# Patient Record
Sex: Female | Born: 1980
Health system: Southern US, Community
[De-identification: ages and names within clinical notes are randomized; demographics above are authoritative.]

## PROBLEM LIST (undated history)

## (undated) DIAGNOSIS — F419 Anxiety disorder, unspecified: Secondary | ICD-10-CM

## (undated) DIAGNOSIS — G43909 Migraine, unspecified, not intractable, without status migrainosus: Secondary | ICD-10-CM

## (undated) DIAGNOSIS — F329 Major depressive disorder, single episode, unspecified: Secondary | ICD-10-CM

## (undated) DIAGNOSIS — E282 Polycystic ovarian syndrome: Secondary | ICD-10-CM

## (undated) DIAGNOSIS — F32A Depression, unspecified: Secondary | ICD-10-CM

## (undated) HISTORY — PX: OTHER SURGICAL HISTORY: SHX169

## (undated) HISTORY — DX: Anxiety disorder, unspecified: F41.9

## (undated) HISTORY — DX: Polycystic ovarian syndrome: E28.2

## (undated) HISTORY — PX: TOOTH EXTRACTION: SUR596

---

## 2000-01-18 ENCOUNTER — Other Ambulatory Visit: Admission: RE | Admit: 2000-01-18 | Discharge: 2000-01-18 | Payer: Self-pay | Admitting: Obstetrics and Gynecology

## 2001-01-24 ENCOUNTER — Other Ambulatory Visit: Admission: RE | Admit: 2001-01-24 | Discharge: 2001-01-24 | Payer: Self-pay | Admitting: Obstetrics and Gynecology

## 2002-08-09 ENCOUNTER — Other Ambulatory Visit: Admission: RE | Admit: 2002-08-09 | Discharge: 2002-08-09 | Payer: Self-pay | Admitting: Obstetrics and Gynecology

## 2007-09-06 ENCOUNTER — Inpatient Hospital Stay (HOSPITAL_COMMUNITY): Admission: AD | Admit: 2007-09-06 | Discharge: 2007-09-07 | Payer: Self-pay | Admitting: Obstetrics and Gynecology

## 2007-10-12 ENCOUNTER — Ambulatory Visit: Payer: Self-pay | Admitting: Nurse Practitioner

## 2007-12-07 ENCOUNTER — Ambulatory Visit: Payer: Self-pay | Admitting: Nurse Practitioner

## 2008-01-09 ENCOUNTER — Emergency Department (HOSPITAL_COMMUNITY): Admission: EM | Admit: 2008-01-09 | Discharge: 2008-01-09 | Payer: Self-pay | Admitting: Emergency Medicine

## 2008-03-09 ENCOUNTER — Inpatient Hospital Stay (HOSPITAL_COMMUNITY): Admission: AD | Admit: 2008-03-09 | Discharge: 2008-03-12 | Payer: Self-pay | Admitting: Obstetrics and Gynecology

## 2008-03-16 ENCOUNTER — Encounter: Admission: RE | Admit: 2008-03-16 | Discharge: 2008-04-15 | Payer: Self-pay | Admitting: Obstetrics and Gynecology

## 2008-04-16 ENCOUNTER — Encounter: Admission: RE | Admit: 2008-04-16 | Discharge: 2008-05-13 | Payer: Self-pay | Admitting: Obstetrics and Gynecology

## 2008-05-14 ENCOUNTER — Encounter: Admission: RE | Admit: 2008-05-14 | Discharge: 2008-06-13 | Payer: Self-pay | Admitting: Obstetrics and Gynecology

## 2008-06-14 ENCOUNTER — Encounter: Admission: RE | Admit: 2008-06-14 | Discharge: 2008-07-13 | Payer: Self-pay | Admitting: Obstetrics and Gynecology

## 2008-07-14 ENCOUNTER — Encounter: Admission: RE | Admit: 2008-07-14 | Discharge: 2008-07-26 | Payer: Self-pay | Admitting: Obstetrics and Gynecology

## 2010-04-19 ENCOUNTER — Other Ambulatory Visit: Payer: Self-pay

## 2010-04-19 DIAGNOSIS — G4482 Headache associated with sexual activity: Secondary | ICD-10-CM

## 2010-05-20 ENCOUNTER — Other Ambulatory Visit (HOSPITAL_COMMUNITY): Payer: Self-pay | Admitting: *Deleted

## 2010-05-20 DIAGNOSIS — R1011 Right upper quadrant pain: Secondary | ICD-10-CM

## 2010-05-26 ENCOUNTER — Ambulatory Visit (HOSPITAL_COMMUNITY)
Admission: RE | Admit: 2010-05-26 | Discharge: 2010-05-26 | Disposition: A | Discharge status: Home / Self Care | Payer: 59 | Source: Ambulatory Visit | Attending: *Deleted | Admitting: *Deleted

## 2010-05-26 DIAGNOSIS — R1011 Right upper quadrant pain: Secondary | ICD-10-CM | POA: Insufficient documentation

## 2010-06-23 ENCOUNTER — Other Ambulatory Visit (HOSPITAL_COMMUNITY): Payer: Self-pay | Admitting: *Deleted

## 2010-06-23 DIAGNOSIS — R1011 Right upper quadrant pain: Secondary | ICD-10-CM

## 2010-06-23 DIAGNOSIS — R110 Nausea: Secondary | ICD-10-CM

## 2010-07-01 ENCOUNTER — Other Ambulatory Visit (HOSPITAL_COMMUNITY): Payer: 59

## 2010-07-06 ENCOUNTER — Ambulatory Visit (HOSPITAL_COMMUNITY)
Admission: RE | Admit: 2010-07-06 | Discharge: 2010-07-06 | Disposition: A | Discharge status: Home / Self Care | Payer: 59 | Source: Ambulatory Visit | Attending: *Deleted | Admitting: *Deleted

## 2010-07-06 DIAGNOSIS — R1011 Right upper quadrant pain: Secondary | ICD-10-CM | POA: Insufficient documentation

## 2010-07-06 DIAGNOSIS — R110 Nausea: Secondary | ICD-10-CM | POA: Insufficient documentation

## 2010-07-06 MED ORDER — TECHNETIUM TC 99M MEBROFENIN IV KIT
5.0000 | PACK | Freq: Once | INTRAVENOUS | Status: AC | PRN
  Administered 2010-07-06: 5.3 via INTRAVENOUS

## 2010-07-28 NOTE — Assessment & Plan Note (Signed)
[name], [name]            ACCOUNT NO.:  [id number]   MEDICAL RECORD NO.:  [id number]          PATIENT TYPE:  POB   LOCATION:  [facility] at [facility]         FACILITY:  [facility]   PHYSICIAN:  Ginger Carne, MD DATE OF BIRTH:  February 24, 1981   DATE OF SERVICE:                                  CLINIC NOTE   The patient comes to the office today for followup on her migraine  headaches and tension headaches.  The patient is currently 26 weeks and  6 days pregnant.  She is doing very well with her pregnancy overall  other than some fatigue.  This is related to working, having a 2-year-  old, and in the process of the selling the house.  Overall, she states  that she definitely feels significantly improved.  She was unable to  start integrative therapy, part this was due to her schedule and part it  was due to the financial aspects.  She has been taking the Flexeril half  or 1 tablet at bedtime and that is definitely helping her.  She has  taken the prednisone and that was helpful.  She has also been taking the  Phenergan and rarely the Percocet, all have been very helpful for her in  controlling her headaches.  She is doing a much better job with this in  general.  She does not feel that she needs a followup at this time.  She  will continue her prenatal care with Dr. Jennette Kettle and return here on an as-  needed basis.      Remonia Richter, NP    ______________________________  Ginger Carne, MD    LR/MEDQ  D:  12/07/2007  T:  12/08/2007  Job:  [phone]

## 2010-07-28 NOTE — Assessment & Plan Note (Signed)
NAMESAAVI, [name]            ACCOUNT NO.:  [id number]   MEDICAL RECORD NO.:  [id number]          PATIENT TYPE:  POB   LOCATION:  [facility] at [facility]         FACILITY:  [facility]   PHYSICIAN:  Ginger Carne, MD DATE OF BIRTH:  April 30, 1980   DATE OF SERVICE:                                  CLINIC NOTE   The patient comes to the office today for headache consultation.  She is  currently 18 weeks and 4 days pregnant.  She is a patient of Dr. Donnetta Hail  and is being cared for from a pregnancy standpoint in that office.  Her  history includes a past medical history of migraine headaches.  She has  had approximately one every 2 months until recently.  In the past, she  has taken Midrin and Tylenol and it has worked well for her.  She has  had one significantly severe enough to send her to the emergency room  recently.  She is G2 P1.  She states that her headaches started when she  was 30 years old.  She denies any aura.  Her headaches are in her right  temple and her right occipital region.  She does have approximately 2  severe per month.  These last approximately 2-3 days each time.  She has  3-4 moderate each month, which are usually cared for with Tylenol and  one mild per month.  She has an MRI taken at [facility].  Her  job is relatively stressful and she is focused on a computer.  She is  again currently pregnant which limits the medicines that she has been  able to take.  So, Dr Jennette Kettle has given her some Percocet and she has been  able to take the Percocet half a tablet and that has worked well for  her.  She has also been given Zofran and Phenergan and each of those has  worked well for her.  She does have some significant triggers including  being very stressed.  Her husband was recently discharged from the [facility]  under less than honorable conditions.  He has been accused of child  pornography.  He had been in the brig for approximately 9 months and is  currently  unemployed.  They are stretched rather financially at this  point as he is not working and she is supporting the family.  The  patient denies any problems with her sleep.   ALLERGIES:  MACROBID and KEFLEX   IMMUNIZATIONS:  She has been immunized to rubella, tetanus, and flu.   OBSTETRICAL HISTORY:  She is gravida 2, para 1.   MEDICAL HISTORY:  She has a history of asthma as a child and migraine  headaches.   SOCIAL HISTORY:  She lives with her husband and child at home.  She  works outside of the home.  She drinks approximately 1-2 caffeinated  beverages per day.   REVIEW OF SYSTEMS:  The patient admits to having frequent headaches,  dizzy spells, photosensitivity, cough, nausea, loss of urine when she  coughs or sneezes and some hot flashes.   PHYSICAL EXAMINATION:  VITAL SIGNS:  Blood pressure is 114/76, weight is  157, height is 5  feet 2 inches, and pulse is 82.  GENERAL:  Well-developed, well-nourished 29 year old Caucasian female in  no acute distress.  The patient's head is normocephalic and atraumatic.  HEENT:  Eyes are equal, pupils react.  NEUROLOGICAL:  The patient is alert, oriented.  Her speech is fluid.  She has no abnormalities of coordination, strength, speech, or thoughts.   ASSESSMENT:  1. Migraine headaches.  2. Muscle spasm.  3. Pregnancy.   PLAN:  We did discuss migraine and muscle spasm at length.  The patient  is having some significant muscle strain as she is sitting at her  computer.  She will start on Flexeril 10 mg one-half tablet at bedtime  #30 with 3 refills.  She will also be referred to integrative therapy to  evaluate and treat her muscle spasms.  She will get a refill on her as  Zofran 4 mg 1 p.o. q.6 h p.r.n. nausea and #20 with 1 refill.  She will  also be given a prednisone pack prednisone 20 mg 4 x1 day, 3 x1 day, 2  x1 day, and 1x1 day #10 with no refills to take if she gets into a  severe headache that is lasting for several days.  We  also did suggest  trigger point injections if she gets into a bad spell, she can always  use that as an option.  The patient is asked to return in 3 weeks or  p.r.n.      Angela Richter, NP    ______________________________  Ginger Carne, MD    LR/MEDQ  D:  10/12/2007  T:  10/13/2007  Job:  [id number]

## 2010-08-26 ENCOUNTER — Encounter: Payer: 59 | Admitting: Family Medicine

## 2010-09-04 ENCOUNTER — Ambulatory Visit (INDEPENDENT_AMBULATORY_CARE_PROVIDER_SITE_OTHER): Payer: 59 | Admitting: Family Medicine

## 2010-09-04 VITALS — BP 113/72 | Ht 61.0 in | Wt 168.0 lb

## 2010-09-04 DIAGNOSIS — M25579 Pain in unspecified ankle and joints of unspecified foot: Secondary | ICD-10-CM

## 2010-09-04 DIAGNOSIS — M2140 Flat foot [pes planus] (acquired), unspecified foot: Secondary | ICD-10-CM

## 2010-09-04 NOTE — Progress Notes (Signed)
  Subjective:    Patient ID: [name], female    DOB: 10-Oct-1980, 30 y.o.   MRN: [id number]  HPI  Referred for bilateral ankle pain. Saw Dr. Margaretha Sheffield he thinks custom molded orthotics might help her bilateral ankle pain. Pain has been going on for a couple of years but worse in the last few months. Worse with standing or walking. Pain is 4/10 at its worst. It does not radiate, it is not associated with any numbness. She has not had any particular injury to her ankles or feet and has not had surgery. She is not diabetic.  Review of Systems Some weight gain with the birth of her last child and small amounts that he gained since then. Has also noticed that her feet are about half size bigger than they were before the birth of her child.    Objective:   Physical Exam GENERAL: Well-developed female no acute distress V.: Pes planus bilaterally ANKLE: Ligaments intact. Normal anterior drawer normal strength in dorsiflexion and plantar flexion. GAIT: Normal stride length. He'll [place]. During stance phase she has a loss of medial collapse of the ankle into a varus position at the ankle joint. This is particularly pronounced on the right.  Patient was fitted for a : standard, cushioned, semi-rigid orthotic. The orthotic was heated, placed on the orthotic stand. The patient was positioned in subtalar neutral position and 10 degrees of ankle dorsiflexion in a weight bearing stance on the heated orthotic blank After completion of molding, a stable base was applied to the orthotic blank. The blank was ground to a stable position for weight bearing. Blank: blue swirl Base:white base Posting:medial heel and flrefoot  Size: 8        Assessment & Plan:  Bilateral ankle pain related to functional varus deformity at her ankle during walking. We may custom molded orthotics today spending 35 minutes face-to-face. She will see how these work for her and if there not improving her symptoms she will  return.

## 2010-11-05 ENCOUNTER — Encounter: Payer: Self-pay | Admitting: Family Medicine

## 2010-11-05 ENCOUNTER — Ambulatory Visit (INDEPENDENT_AMBULATORY_CARE_PROVIDER_SITE_OTHER): Payer: 59 | Admitting: Family Medicine

## 2010-11-05 VITALS — BP 111/76 | HR 80 | Ht 62.0 in | Wt 170.0 lb

## 2010-11-05 DIAGNOSIS — M2140 Flat foot [pes planus] (acquired), unspecified foot: Secondary | ICD-10-CM

## 2010-11-05 DIAGNOSIS — M722 Plantar fascial fibromatosis: Secondary | ICD-10-CM

## 2010-11-05 NOTE — Progress Notes (Signed)
  Subjective:    Patient ID: [name], female    DOB: October 20, 1980, 30 y.o.   MRN: [id number]  HPI Bilateral heel pain. And got some orthotics which really helped her ankle pain. Over the last few weeks she's noticed she started to have some pain on the bottom part of her heel similar to plantar fasciitis type pain. Worse with standing long periods. Has had a.m. pain with the first day. No numbness or tingling in her toes she's had no new injury and has not changed her work habits or work environment.   Review of Systems Denies rash, erythema or numbness in her feet.    Objective:   Physical Exam   GENERAL: Well-developed female no acute distress FEET: Significant pes planus with medial foot collapse inward. She also has some also transverse arch. She has a short wide foot. She is neurovascularly intact in her foot. GA IT: Normal stride length. On stance phase she has significant medial foot collapse without her orthotics.     Assessment & Plan:  Bilateral plantar fasciitis Improved bilateral ankle pain with continued pes planus now significantly improved but not totally corrected by her orthotics. Today added small gray colored scaphoid pads. This seemed to improve the pressure on the medial part of her foot. I think she's having some impingement of the plantar nerve there. We'll try this for a few weeks it is not improving she'll return in orthotics will make some additional adjustments

## 2010-12-18 LAB — CBC
HCT: 30 % — ABNORMAL LOW (ref 36.0–46.0)
Hemoglobin: 10.2 g/dL — ABNORMAL LOW (ref 12.0–15.0)
Hemoglobin: 9.4 g/dL — ABNORMAL LOW (ref 12.0–15.0)
MCHC: 33.1 g/dL (ref 30.0–36.0)
MCHC: 34 g/dL (ref 30.0–36.0)
MCV: 79.1 fL (ref 78.0–100.0)
RBC: 3.55 MIL/uL — ABNORMAL LOW (ref 3.87–5.11)
RBC: 3.79 MIL/uL — ABNORMAL LOW (ref 3.87–5.11)
RDW: 15.6 % — ABNORMAL HIGH (ref 11.5–15.5)
WBC: 10.8 10*3/uL — ABNORMAL HIGH (ref 4.0–10.5)

## 2010-12-18 LAB — RPR: RPR Ser Ql: NONREACTIVE

## 2011-09-10 ENCOUNTER — Ambulatory Visit (INDEPENDENT_AMBULATORY_CARE_PROVIDER_SITE_OTHER): Payer: 59 | Admitting: Family Medicine

## 2011-09-10 ENCOUNTER — Encounter: Payer: Self-pay | Admitting: Family Medicine

## 2011-09-10 VITALS — BP 120/78 | HR 69

## 2011-09-10 DIAGNOSIS — M722 Plantar fascial fibromatosis: Secondary | ICD-10-CM

## 2011-09-10 DIAGNOSIS — M25579 Pain in unspecified ankle and joints of unspecified foot: Secondary | ICD-10-CM

## 2011-09-10 DIAGNOSIS — M2140 Flat foot [pes planus] (acquired), unspecified foot: Secondary | ICD-10-CM

## 2011-09-10 NOTE — Progress Notes (Signed)
  Subjective:    Patient ID: [name], female    DOB: 14-Feb-1981, 31 y.o.   MRN: [id number]  HPI  Ms. watch her very is a pleasant 30 years of patient in followup of her bilateral ankle pain, but her plantar fasciitis. She was seen recently, in the plan with her was to make custom orthotics for her which she has used in the past and hasn't helped her tremendously with her collateral ankle pain and Plantar fasciitis. She states that her bilateral ankle pain still present, on and off, worse with certain activities coming through arrest, custom orthotics definitely help her with the pain. We will proceed with her new custom orthotics today   Patient Active Problem List  Diagnosis  . Pes planus  . Ankle pain  . Plantar fasciitis   No current outpatient prescriptions on file prior to visit.   Allergies  Allergen Reactions  . Nitrofurantoin Monohyd Macro Hives and Itching  . Cephalexin Rash     Review of Systems  Constitutional: Negative for fever, chills, diaphoresis and fatigue.  Musculoskeletal: Positive for gait problem.  Neurological: Negative for weakness and numbness.       Objective:   Physical Exam  Constitutional: She is oriented to person, place, and time. She appears well-developed and well-nourished.       BP 120/78  Pulse 69   Pulmonary/Chest: Effort normal.  Musculoskeletal:       Bilateral ankles with full range of motion. The recurrence of the patient in the medial aspect of both ankles. There is collapse of the mid arch of the foot. Gait with bilateral heel pronation Neurovascular intact  Neurological: She is alert and oriented to person, place, and time.  Skin: Skin is warm. No rash noted. No erythema.  Psychiatric: She has a normal mood and affect. Her behavior is normal. Thought content normal.          Assessment & Plan:   1. Plantar fasciitis   2. Ankle pain   3. Pes planus    Patient was fitted for a : standard, cushioned,  semi-rigid orthotic. The orthotic was heated and afterward the patient stood on the orthotic blank positioned on the orthotic stand. The patient was positioned in subtalar neutral position and 10 degrees of ankle dorsiflexion in a weight bearing stance. After completion of molding, a stable base was applied to the orthotic blank. The blank was ground to a stable position for weight bearing. Size:9 Base:red Posting:anteromedial and posteromedial B/L Additional orthotic padding:none

## 2011-09-30 ENCOUNTER — Encounter: Payer: 59 | Admitting: Internal Medicine

## 2011-10-12 ENCOUNTER — Ambulatory Visit (INDEPENDENT_AMBULATORY_CARE_PROVIDER_SITE_OTHER): Payer: 59 | Admitting: Nurse Practitioner

## 2011-10-12 ENCOUNTER — Ambulatory Visit: Payer: 59 | Admitting: Nurse Practitioner

## 2011-10-12 ENCOUNTER — Encounter: Payer: Self-pay | Admitting: Nurse Practitioner

## 2011-10-12 VITALS — BP 105/76 | HR 63 | Ht 61.75 in | Wt 167.0 lb

## 2011-10-12 DIAGNOSIS — E669 Obesity, unspecified: Secondary | ICD-10-CM

## 2011-10-12 DIAGNOSIS — F329 Major depressive disorder, single episode, unspecified: Secondary | ICD-10-CM | POA: Insufficient documentation

## 2011-10-12 DIAGNOSIS — Z6837 Body mass index (BMI) 37.0-37.9, adult: Secondary | ICD-10-CM | POA: Insufficient documentation

## 2011-10-12 DIAGNOSIS — G43009 Migraine without aura, not intractable, without status migrainosus: Secondary | ICD-10-CM | POA: Insufficient documentation

## 2011-10-12 MED ORDER — RIZATRIPTAN BENZOATE 10 MG PO TABS: 10.0000 mg | ORAL_TABLET | ORAL | Status: DC | PRN

## 2011-10-12 MED ORDER — IBUPROFEN 800 MG PO TABS: 800.0000 mg | ORAL_TABLET | Freq: Three times a day (TID) | ORAL | Status: AC | PRN

## 2011-10-12 NOTE — Patient Instructions (Signed)

## 2011-10-12 NOTE — Progress Notes (Signed)
S: Pt last seen Sept 2009 when she was pregnant. She since delivered a baby girl and has done well with migraines without aura until the last 6 months. She has been diagnosed with major depression and is receiving counseling with Valinda Hoar. She has been started on Effexor 75 mg. She took a friends Relpax and it worked very well to make her migraine go away. Currently she is having one bad migraine per week that puts her to bed. She is using a [place] IUD for birth control. She and her husband are doing better and he has found a job. He still feels self conscious about being on the sexual offender registry. She is requesting a trigger point injection today for the right side of her head.   O: General NAD, 31 YO Caucasian female Head tenderness at right occipital and trap regions [facility] negative  Trigger Point injections: see procedure note 5cc in right occipital, trapezius and right temple  A: Migraine without aura Depression  P: Trigger point injections Maxalt 10 mg po prn Motrin 800mg  TID prn Offered Physical Therapy/ Pt declined RTC prn

## 2011-12-03 ENCOUNTER — Encounter: Payer: Self-pay | Admitting: Family Medicine

## 2011-12-03 ENCOUNTER — Ambulatory Visit (HOSPITAL_COMMUNITY)
Admission: RE | Admit: 2011-12-03 | Discharge: 2011-12-03 | Disposition: A | Discharge status: Home / Self Care | Payer: 59 | Source: Ambulatory Visit | Attending: Family Medicine | Admitting: Family Medicine

## 2011-12-03 ENCOUNTER — Ambulatory Visit (INDEPENDENT_AMBULATORY_CARE_PROVIDER_SITE_OTHER): Payer: 59 | Admitting: Family Medicine

## 2011-12-03 VITALS — BP 124/81 | HR 93 | Ht 61.75 in | Wt 167.0 lb

## 2011-12-03 DIAGNOSIS — M25539 Pain in unspecified wrist: Secondary | ICD-10-CM | POA: Insufficient documentation

## 2011-12-03 DIAGNOSIS — M25531 Pain in right wrist: Secondary | ICD-10-CM

## 2011-12-03 MED ORDER — TRAMADOL HCL 50 MG PO TABS: ORAL_TABLET | ORAL | Status: DC

## 2011-12-06 ENCOUNTER — Telehealth: Payer: Self-pay | Admitting: Family Medicine

## 2011-12-06 NOTE — Telephone Encounter (Signed)
AMy Can u call her and tellher the X RAYS are NORMAL. Go ahead and start the exercise program. She can f/u 1 month or prn THANKS! Denny Levy

## 2011-12-06 NOTE — Telephone Encounter (Signed)
Spoke with pt- gave her x-ray results. 

## 2011-12-06 NOTE — Telephone Encounter (Signed)
Called pt, unable to leave VM.  Will try later.

## 2011-12-07 DIAGNOSIS — M25531 Pain in right wrist: Secondary | ICD-10-CM | POA: Insufficient documentation

## 2011-12-07 NOTE — Progress Notes (Signed)
  Subjective:    Patient ID: [name], female    DOB: 04/30/1980, 31 y.o.   MRN: [id number]  HPI  Right wrist pain, episodic, occurred on 2 separate occasions. No known injury although in retrospect the day before the last episode she had done asked her vacuuming because she got a new back and cleaner. She does not recall any specific change in activities the day before the first episode. During the episode pain is 9/10, keeps her from sleeping. Aching in the wrist up into the forearm. First episode was on Labor Day this year and the second episode was 2 days prior to this office visit.  She took over-the-counter remedies including NSAIDs and had little or no relief. Gradually over the 24-hour period the pain resolved. She did not have any numbness in the hand, she noted no color change in the skin, no specific swelling.   PERTINENT  PMH / PSH: No personal history of diabetes mellitus.  Right wrist/hand injury many years ago when she fell while roller skating. Evidently was in a thumb spica splint for about 6 weeks for a scaphoid fracture. Has not had any problems with her wrist since then until these 2 episodes. She is an MRI tech at: [employer] Review of Systems With each episode she had no associated symptoms of fever, sweats, chills. In the recent few months she has not had any unusual weight change.    Objective:   Physical Exam Vital signs are reviewed GENERAL: Well-developed female no acute distress WRISTS: Bilaterally there is no evidence of swelling. The wrists are symmetrical. The skin is normal in color. RIGHT wrist: Slight tenderness over the dorsal scapholunate area. This increases with resisted wrist extension. Otherwise the wrist has normal range of motion. Fingers have normal range of motion in all planes and intact strength and neurovascular status.  ; Right wrist or Center reveals no fluid in the radiocarpal joint. There is a mild amount of increased fluid around  the ECRB and ECRL tendons in the second compartment but the tendons are intact and move normally. I cannot identify with certainty the scapholunate ligament. I do not appreciate a widened scapholunate space however this is not diagnostic on ultrasound.       Assessment & Plan:

## 2011-12-07 NOTE — Assessment & Plan Note (Signed)
I will get x-rays to further rule out any residual scaphoid pads college he as well as evaluate the scapholunate distance on a clenched fist view. I suspect that each of these episodes is brought about by some unnoticed but increased activity such as extra vacuuming the day before the last episode. I have put her on her wrist strengthening program. She will hold on that until we get her x-rays. I've also given her small amount of tramadol in case she has another episode of pain. I will contact her after the x-ray results. If they are negative then I would just continue with the wrist rehabilitation and followup when necessary.

## 2012-04-26 ENCOUNTER — Emergency Department (HOSPITAL_COMMUNITY): Payer: 59

## 2012-04-26 ENCOUNTER — Encounter (HOSPITAL_COMMUNITY): Payer: Self-pay

## 2012-04-26 ENCOUNTER — Emergency Department (HOSPITAL_COMMUNITY)
Admission: EM | Admit: 2012-04-26 | Discharge: 2012-04-26 | Disposition: A | Discharge status: Home / Self Care | Payer: 59 | Attending: Emergency Medicine | Admitting: Emergency Medicine

## 2012-04-26 DIAGNOSIS — F329 Major depressive disorder, single episode, unspecified: Secondary | ICD-10-CM | POA: Insufficient documentation

## 2012-04-26 DIAGNOSIS — F3289 Other specified depressive episodes: Secondary | ICD-10-CM | POA: Insufficient documentation

## 2012-04-26 DIAGNOSIS — H5710 Ocular pain, unspecified eye: Secondary | ICD-10-CM | POA: Insufficient documentation

## 2012-04-26 DIAGNOSIS — H53149 Visual discomfort, unspecified: Secondary | ICD-10-CM | POA: Insufficient documentation

## 2012-04-26 DIAGNOSIS — Z79899 Other long term (current) drug therapy: Secondary | ICD-10-CM | POA: Insufficient documentation

## 2012-04-26 DIAGNOSIS — R209 Unspecified disturbances of skin sensation: Secondary | ICD-10-CM | POA: Insufficient documentation

## 2012-04-26 DIAGNOSIS — G43909 Migraine, unspecified, not intractable, without status migrainosus: Secondary | ICD-10-CM | POA: Insufficient documentation

## 2012-04-26 DIAGNOSIS — R112 Nausea with vomiting, unspecified: Secondary | ICD-10-CM | POA: Insufficient documentation

## 2012-04-26 HISTORY — DX: Major depressive disorder, single episode, unspecified: F32.9

## 2012-04-26 HISTORY — DX: Depression, unspecified: F32.A

## 2012-04-26 HISTORY — DX: Migraine, unspecified, not intractable, without status migrainosus: G43.909

## 2012-04-26 MED ORDER — HYDROMORPHONE HCL PF 1 MG/ML IJ SOLN
1.0000 mg | Freq: Once | INTRAMUSCULAR | Status: AC
  Administered 2012-04-26: 1 mg via INTRAMUSCULAR
  Filled 2012-04-26: qty 1

## 2012-04-26 MED ORDER — DIPHENHYDRAMINE HCL 50 MG/ML IJ SOLN
25.0000 mg | Freq: Once | INTRAMUSCULAR | Status: AC
  Administered 2012-04-26: 25 mg via INTRAVENOUS
  Filled 2012-04-26: qty 1

## 2012-04-26 MED ORDER — DEXAMETHASONE SODIUM PHOSPHATE 10 MG/ML IJ SOLN
10.0000 mg | Freq: Once | INTRAMUSCULAR | Status: AC
  Administered 2012-04-26: 10 mg via INTRAVENOUS
  Filled 2012-04-26: qty 1

## 2012-04-26 MED ORDER — ONDANSETRON HCL 4 MG/2ML IJ SOLN
4.0000 mg | Freq: Once | INTRAMUSCULAR | Status: AC
  Administered 2012-04-26: 4 mg via INTRAVENOUS
  Filled 2012-04-26: qty 2

## 2012-04-26 MED ORDER — METOCLOPRAMIDE HCL 5 MG/ML IJ SOLN
10.0000 mg | Freq: Once | INTRAMUSCULAR | Status: AC
  Administered 2012-04-26: 10 mg via INTRAVENOUS
  Filled 2012-04-26: qty 2

## 2012-04-26 NOTE — ED Notes (Signed)
Pt c/o migraine headache with n/v since last night.  Took tramadol with some relief last night but this morning is unable to keep anything down.  Says usually takes maxalt but ran out.  Pt says left hand feels tingly.

## 2012-04-26 NOTE — ED Notes (Signed)
Pt given cola, tolerated well thus far.

## 2012-04-26 NOTE — ED Provider Notes (Signed)
History     CSN: [id number]  Arrival date & time 04/26/12  1034   First MD Initiated Contact with Patient 04/26/12 1054      Chief Complaint  Patient presents with  . Headache    (Consider location/radiation/quality/duration/timing/severity/associated sxs/prior treatment) HPI Comments: [name] is a 32 y.o. Female presenting withmigraine headache which started gradually last night.  The patient has a history of migraine and the current symptoms are similar to previous episodes of migraine headache in location, but she has new faint tingling in her left hand which she has not experienced before,  Although has had left facial tingling with migraine in the past which is not present today.  She is very nauseated with emesis, not able to keep any PO's down.  She has photophobia which is consistent with migraine.   The patient has bilateral foehead and posterior eye pain.  There has been no fevers, chills, syncope, confusion or localized weakness.  The patient tried tramadol without relief of symptoms.  She usually improves with maxalt when taken early in the course of her headache.  She has run out of her home supply.        The history is provided by the patient and the spouse.    Past Medical History  Diagnosis Date  . Migraines   . Depression     History reviewed. No pertinent past surgical history.  No family history on file.  History  Substance Use Topics  . Smoking status: Never Smoker   . Smokeless tobacco: Never Used  . Alcohol Use: No    OB History   Grav Para Term Preterm Abortions TAB SAB Ect Mult Living                  Review of Systems  Constitutional: Negative for fever and chills.  HENT: Negative for congestion, sore throat, facial swelling, neck pain and neck stiffness.   Eyes: Positive for photophobia.  Respiratory: Negative for chest tightness and shortness of breath.   Cardiovascular: Negative for chest pain.  Gastrointestinal: Positive  for nausea and vomiting. Negative for abdominal pain.  Genitourinary: Negative.   Musculoskeletal: Negative for joint swelling and arthralgias.  Skin: Negative.  Negative for rash and wound.  Neurological: Positive for numbness and headaches. Negative for dizziness, tremors, facial asymmetry, speech difficulty, weakness and light-headedness.  Psychiatric/Behavioral: Negative.     Allergies  Nitrofurantoin monohyd macro and Cephalexin  Home Medications   Current Outpatient Rx  Name  Route  Sig  Dispense  Refill  . acetaminophen (TYLENOL) 500 MG tablet   Oral   Take 1,000 mg by mouth every 6 (six) hours as needed for pain.         . pseudoephedrine (SUDAFED) 30 MG tablet   Oral   Take 30 mg by mouth every 4 (four) hours as needed for congestion.         . traMADol (ULTRAM) 50 MG tablet   Oral   Take 50 mg by mouth every 6 (six) hours as needed for pain.         Marland Kitchen venlafaxine XR (EFFEXOR-XR) 150 MG 24 hr capsule   Oral   Take 150 mg by mouth daily.         . rizatriptan (MAXALT) 10 MG tablet   Oral   Take 1 tablet (10 mg total) by mouth as needed for migraine. May repeat in 2 hours if needed   12 tablet   11  BP 112/65  Pulse 96  Temp(Src) 97.6 F (36.4 C) (Oral)  Resp 17  Ht 5\' 1"  (1.549 m)  Wt 170 lb (77.111 kg)  BMI 32.14 kg/m2  SpO2 99%  LMP 04/23/2012  Physical Exam  Nursing note and vitals reviewed. Constitutional: She is oriented to person, place, and time. She appears well-developed and well-nourished.  Uncomfortable appearing  HENT:  Head: Normocephalic and atraumatic.  Mouth/Throat: Oropharynx is clear and moist.  Eyes: Conjunctivae are normal. Pupils are equal, round, and reactive to light.  Neck: Normal range of motion. Neck supple.  Cardiovascular: Normal rate and normal heart sounds.   Pulmonary/Chest: Effort normal.  Abdominal: Soft. There is no tenderness.  Musculoskeletal: Normal range of motion.  Lymphadenopathy:    She has no  cervical adenopathy.  Neurological: She is alert and oriented to person, place, and time. She has normal strength. No cranial nerve deficit or sensory deficit. Coordination and gait normal. GCS eye subscore is 4. GCS verbal subscore is 5. GCS motor subscore is 6.  Cranial nerves III-XII intact.  No pronator drift.  Equal grip strength.  Skin: Skin is warm and dry. No rash noted.  Psychiatric: She has a normal mood and affect. Her speech is normal and behavior is normal. Thought content normal. Cognition and memory are normal.    ED Course  Procedures (including critical care time)  Labs Reviewed - No data to display Ct Head Wo Contrast  04/26/2012  *RADIOLOGY REPORT*  Clinical Data: Headache.  CT HEAD WITHOUT CONTRAST  Technique:  Contiguous axial images were obtained from the base of the skull through the vertex without contrast.  Comparison: No priors.  Findings: No acute intracranial abnormalities.  Specifically, no evidence of acute intracerebral hemorrhage, no definite findings of acute/subacute cerebral ischemia, no mass, mass effect, hydrocephalus or abnormal intra or extra-axial fluid collections. No acute displaced skull fractures are identified.  Visualized paranasal sinuses and mastoids are well pneumatized.  IMPRESSION: 1.  No acute intracranial abnormalities. 2.  The appearance of the brain is normal.   Original Report Authenticated By: Trudie Reed, M.D.      1. Migraine headache     Patient given NS IV 1 liter,  Medicated with Migraine cocktail of decadron, benadryl and reglan.  No relief of headache or nausea.  CT head ordered.  She was given dilaudid 1 mg and zofran 4 IV.  Near complete relief of headache,  No nausea.  Pt has tolerated PO liquids in ed and was ready and anxious to go home at time of dc.    MDM  Pt with history of migraine and similar sx today,  Although different pattern of neuropathy,  No focal weakness appreciated.  PRN f/u.  Ct head results reviewed and  discussed with patient.          Burgess Amor, [place] 04/26/12 1806

## 2012-04-26 NOTE — ED Notes (Signed)
States has had headache since last night. Reports increased pain with light. Denies neck pain pain and vision changes. Pt has been treated for migraines over several years. Pt reports N/V.  Pt took tylenol and sudafed this morning. NAD noted.

## 2012-04-28 NOTE — ED Provider Notes (Signed)
Medical screening examination/treatment/procedure(s) were performed by non-physician practitioner and as supervising physician I was immediately available for consultation/collaboration.  Keyonia Gluth, MD 04/28/12 2048 

## 2012-06-26 ENCOUNTER — Institutional Professional Consult (permissible substitution): Payer: 59 | Admitting: Pulmonary Disease

## 2012-06-27 ENCOUNTER — Encounter: Payer: Self-pay | Admitting: Pulmonary Disease

## 2012-06-27 ENCOUNTER — Ambulatory Visit (INDEPENDENT_AMBULATORY_CARE_PROVIDER_SITE_OTHER): Payer: 59 | Admitting: Pulmonary Disease

## 2012-06-27 VITALS — BP 104/80 | HR 102 | Temp 98.4°F | Ht 62.0 in | Wt 188.8 lb

## 2012-06-27 DIAGNOSIS — G4733 Obstructive sleep apnea (adult) (pediatric): Secondary | ICD-10-CM

## 2012-06-27 NOTE — Progress Notes (Signed)
Subjective:    Patient ID: [name], female    DOB: 03-17-1980, 32 y.o.   MRN: [id number]  HPI The patient is a 32 year old female who comes in today for evaluation of possible sleep apnea.  She has significant snoring, and has been told that she has an abnormal breathing pattern during sleep at times.  She denies having choking arousals.  She has frequent awakenings at night, and does not feel rested in the mornings upon arising.  She finds that sleeping longer really does not help her feel any better.  She has definite periods of sleepiness with inactivity during the day, and is extremely fatigued.  She can also follow sleep easily and the evenings watching television.  She denies any sleepiness with driving.  The patient states that her weight is up 25 pounds over the last 2 years, and her fourth score today is 7.   Sleep Questionnaire What time do you typically go to bed?( Between what hours) 10-11p depends on shift at Hospital--sometimes at 9pm 10-11p depends on shift at Hospital--sometimes at 9pm at 1441 on 06/27/12 by Nita Sells, CMA How long does it take you to fall asleep? 15 mins +/- 15 mins +/- at 1441 on 06/27/12 by Nita Sells, CMA How many times during the night do you wake up? 3 3 at 1441 on 06/27/12 by Nita Sells, CMA What time do you get out of bed to start your day? [id number] [phone] at 1441 on 06/27/12 by Nita Sells, CMA Do you drive or operate heavy machinery in your occupation? No No at 1441 on 06/27/12 by Nita Sells, CMA How much has your weight changed (up or down) over the past two years? (In pounds) 25 lb (11.34 kg)25 lb (11.34 kg) increase at 1441 on 06/27/12 by Nita Sells, CMA Have you ever had a sleep study before? No No at 1441 on 06/27/12 by Marjo Bicker Mabe, CMA Do you currently use CPAP? No No at 1441 on 06/27/12 by Marjo Bicker Mabe, CMA Do you wear oxygen at any time? No No at 1441 on 06/27/12 by Marjo Bicker Mabe, CMA   Review of  Systems  Constitutional: Negative for fever and unexpected weight change.  HENT: Negative for ear pain, nosebleeds, congestion, sore throat, rhinorrhea, sneezing, trouble swallowing, dental problem, postnasal drip and sinus pressure.        Seasonal allergy symptoms   Eyes: Negative for redness and itching.  Respiratory: Negative for cough, chest tightness, shortness of breath and wheezing.   Cardiovascular: Negative for palpitations and leg swelling.  Gastrointestinal: Negative for nausea and vomiting.  Genitourinary: Negative for dysuria.  Musculoskeletal: Negative for joint swelling.  Skin: Negative for rash.  Neurological: Positive for headaches ( chronic migraines).  Hematological: Does not bruise/bleed easily.  Psychiatric/Behavioral: Positive for dysphoric mood. The patient is nervous/anxious.        Controlled with medication       Objective:   Physical Exam Constitutional:  Overweight female, no acute distress  HENT:  Nares patent without discharge  Oropharynx without exudate, palate and uvula are mildly elongated, some narrowing posteriorly from side walls  Eyes:  Perrla, eomi, no scleral icterus  Neck:  No JVD, no TMG  Cardiovascular:  Normal rate, regular rhythm, no rubs or gallops.  No murmurs        Intact distal pulses  Pulmonary :  Normal breath sounds, no stridor or respiratory distress   No rales, rhonchi, or wheezing  Abdominal:  Soft, nondistended, bowel sounds present.  No tenderness noted.   Musculoskeletal:  No lower extremity edema noted.  Lymph Nodes:  No cervical lymphadenopathy noted  Skin:  No cyanosis noted  Neurologic:  Alert, appropriate, moves all 4 extremities without obvious deficit.         Assessment & Plan:

## 2012-06-27 NOTE — Assessment & Plan Note (Signed)
The patient's history is very suggestive of obstructive sleep apnea, and I have had a long discussion with her about the pathophysiology and impacted quality of life and cardiovascular health.  I think she would benefit from a sleep study, and is a good candidate for home sleep testing.  The patient is agreeable to this approach.

## 2012-06-27 NOTE — Patient Instructions (Addendum)
Will schedule you for a home sleep study, and arrange followup once the results are available.  Work on weight reduction.

## 2012-07-10 ENCOUNTER — Ambulatory Visit (INDEPENDENT_AMBULATORY_CARE_PROVIDER_SITE_OTHER): Payer: 59 | Admitting: Pulmonary Disease

## 2012-07-10 DIAGNOSIS — G4733 Obstructive sleep apnea (adult) (pediatric): Secondary | ICD-10-CM

## 2012-07-11 DIAGNOSIS — G4733 Obstructive sleep apnea (adult) (pediatric): Secondary | ICD-10-CM

## 2012-07-13 ENCOUNTER — Encounter: Payer: Self-pay | Admitting: Pulmonary Disease

## 2012-07-14 ENCOUNTER — Telehealth: Payer: Self-pay | Admitting: Pulmonary Disease

## 2012-07-14 NOTE — Telephone Encounter (Signed)
Discussed HST with pt.  She does not have osa, but I suspect she may have UARS.  I have recommended working on positional therapy and weight loss.  She is to call if worsening symptoms.

## 2012-07-14 NOTE — Telephone Encounter (Signed)
Per [facility], he wants to speak with the pt Call her at [phone] Thanks

## 2012-07-18 NOTE — Telephone Encounter (Signed)
Will close message.  

## 2014-05-24 ENCOUNTER — Encounter: Payer: Self-pay | Admitting: Family Medicine

## 2014-05-24 ENCOUNTER — Ambulatory Visit (INDEPENDENT_AMBULATORY_CARE_PROVIDER_SITE_OTHER): Payer: 59 | Admitting: Family Medicine

## 2014-05-24 VITALS — BP 117/86 | Ht 61.5 in | Wt 192.0 lb

## 2014-05-24 DIAGNOSIS — M2140 Flat foot [pes planus] (acquired), unspecified foot: Secondary | ICD-10-CM

## 2014-05-24 DIAGNOSIS — M722 Plantar fascial fibromatosis: Secondary | ICD-10-CM

## 2014-05-24 DIAGNOSIS — M25579 Pain in unspecified ankle and joints of unspecified foot: Secondary | ICD-10-CM

## 2014-05-24 NOTE — Progress Notes (Signed)
Patient ID: [name], female   DOB: 08/25/1980, 34 y.o.   MRN: [id number]  [name] - 34 y.o. female MRN [id number]  Date of birth: 1980/03/19    SUBJECTIVE:     Bilateral foot pain. She had been doing well with the orthotics we made her until the last couple of months. In general her feet are just diffusely hurting more. The old orthotics seem to help her but they made her foot so crowded in her shoe that he wore a hole in the top of her shoe. Over the last week or so she's had a little bit of pain on the plantar surface of the right foot similar to her plantar fasciitis type pain but not as severe. 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 She's also had some tightness and cramping in her bilateral calves particularly after a long day on the job. ROS:     No fever, sweats, chills, unusual weight change. She's had no swelling of the ankles or feet. She's had no redness noted in the lower leg on either side.  PERTINENT  PMH / PSH FH / / SH:  Past Medical, Surgical, Social, and Family History Reviewed & Updated in the EMR.  Pertinent findings include:  Obesity Obstructive sleep apnea Pes planus History of plantar fasciitis  OBJECTIVE: BP 117/86 mmHg  Ht 5' 1.5" (1.562 m)  Wt 192 lb (87.091 kg)  BMI 35.70 kg/m2  Physical Exam:  Vital signs are reviewed. GEN.: Well-developed overweight female no acute distress FEET: Bilaterally she has a broad, short foot with some mild to moderate loss of the transverse and longitudinal arch. Neuro: Intact sensation soft touch bilaterally on the feet. Gait: Normal stride length. She has a little bit of out toeing particularly on the right. She has a bit of over pronation also on the right. The left foot tends to collapse medially with stance phase. Skin: No excess callus, no skin lesions or rash noted on the feet. No blisters. Vascular:Dorsalis pedis pulses 2+ bilaterally equal   Patient was fitted for a : standard,  cushioned, semi-rigid orthotic. The orthotic was heated, placed on the orthotic stand. The patient was positioned in subtalar neutral position and 10 degrees of ankle dorsiflexion in a weight bearing stance on the heated orthotic blank After completion of molding, a stable base was applied to the orthotic blank. The blank was ground to a stable position for weight bearing. Blank: Size 8 red Base: Blue foam Posting: Bilateral scaphoid pads. Also put a small heel post medially and some padding under the first ray on the right foot. Had to trend down the with of th across the metatarsal head areas.  Face to face time spent in evaluation, measurement and manufacture of custom molded orthotic was 40 minutes.  ASSESSMENT & PLAN:  See problem based charting & AVS for pt instructions.

## 2014-09-13 ENCOUNTER — Encounter: Payer: Self-pay | Admitting: Podiatry

## 2014-09-13 ENCOUNTER — Ambulatory Visit (INDEPENDENT_AMBULATORY_CARE_PROVIDER_SITE_OTHER): Payer: 59 | Admitting: Podiatry

## 2014-09-13 VITALS — BP 125/80 | HR 87 | Ht 61.0 in | Wt 192.0 lb

## 2014-09-13 DIAGNOSIS — M216X2 Other acquired deformities of left foot: Secondary | ICD-10-CM

## 2014-09-13 DIAGNOSIS — M722 Plantar fascial fibromatosis: Secondary | ICD-10-CM

## 2014-09-13 DIAGNOSIS — M216X9 Other acquired deformities of unspecified foot: Secondary | ICD-10-CM | POA: Diagnosis not present

## 2014-09-13 DIAGNOSIS — M21969 Unspecified acquired deformity of unspecified lower leg: Secondary | ICD-10-CM

## 2014-09-13 DIAGNOSIS — M216X1 Other acquired deformities of right foot: Secondary | ICD-10-CM

## 2014-09-13 MED ORDER — NABUMETONE 500 MG PO TABS: 500.0000 mg | ORAL_TABLET | Freq: Two times a day (BID) | ORAL | Status: DC

## 2014-09-13 NOTE — Progress Notes (Signed)
Subjective: 34 year old female presents complaining of pain in heel, arch both feet x 5 years, R>L. Wearing orthotics that were made by a sport medicine, 3 month ago. At the end of the day right foot is very sore. On feet at work not much, works as Tax adviser at [employer].  Patient has two children and is hoping to have 3rd.   Review of Systems - General ROS: negative Ophthalmic ROS: negative ENT ROS: negative Allergy and Immunology ROS: negative Endocrine ROS: negative Breast ROS: negative for breast lumps Respiratory ROS: no cough, shortness of breath, or wheezing Cardiovascular ROS: no chest pain or dyspnea on exertion Gastrointestinal ROS: no abdominal pain, change in bowel habits, or black or bloody stools Musculoskeletal ROS: negative Neurological ROS: no TIA or stroke symptoms Dermatological ROS: negative.  Objective: Dermatologic: Normal skin without open lesions. Neurologic: All epicritic and tactile sensations grossly intact. Denies numbness or abnormal sensations. Broad plantar fascial band pain at the end of the day, more on heel area. Vascular: All pedal pulses are palpable. No edema or erythema noted. Orthopedic: Positive of tight Achilles tendon right. Positive of excess sagittal plane motion of the first ray with forefoot loading. Positive of STJ hyperpronation bilateral with weight bearing. Positive of forefoot varus bilateral.  Assessment: Plantar fasciitis right>left, over 5 years. Hypermobile first ray bilateral. Ankle equinus right.  STJ hyperpronation bilateral.  Plan: Reviewed clinical findings and available treatment options. Continue with orthotics. Ankle brace dispensed for right. Metatarsal binder dispensed for both feet. Stretch exercise reviewed for tight Achilles tendon right.

## 2014-09-13 NOTE — Patient Instructions (Signed)
Seen for bilateral heel pain. Noted of weak first Metatarsal bones and tight Right Achilles tendon. Ankle brace dispensed. Use Metatarsal binder as needed. Stay in [place]. Return in 2 weeks.

## 2014-09-25 ENCOUNTER — Ambulatory Visit (INDEPENDENT_AMBULATORY_CARE_PROVIDER_SITE_OTHER): Payer: 59 | Admitting: Podiatry

## 2014-09-25 ENCOUNTER — Encounter: Payer: Self-pay | Admitting: Podiatry

## 2014-09-25 VITALS — BP 135/86 | HR 91

## 2014-09-25 DIAGNOSIS — M216X2 Other acquired deformities of left foot: Secondary | ICD-10-CM | POA: Diagnosis not present

## 2014-09-25 DIAGNOSIS — M722 Plantar fascial fibromatosis: Secondary | ICD-10-CM | POA: Diagnosis not present

## 2014-09-25 DIAGNOSIS — M216X1 Other acquired deformities of right foot: Secondary | ICD-10-CM

## 2014-09-25 NOTE — Progress Notes (Signed)
Subjective: 34 year old female presents for follow up on bilateral heel pain R>L. Ankle brace is helping, but still having pain if not used.   Objective: Dermatologic: Normal skin without open lesions. Neurologic: All epicritic and tactile sensations grossly intact. Denies numbness or abnormal sensations. Broad plantar fascial band pain at the end of the day, more on heel area. Vascular: All pedal pulses are palpable. No edema or erythema noted. Orthopedic: Positive of tight Achilles tendon right. Positive of excess sagittal plane motion of the first ray with forefoot loading. Positive of STJ hyperpronation bilateral with weight bearing. Positive of forefoot varus bilateral.  Assessment: Plantar fasciitis right>left, over 5 years. Hypermobile first ray bilateral. Ankle equinus right.  STJ hyperpronation bilateral.  Plan: Reviewed clinical findings and available treatment options. Continue with orthotics. Ankle brace dispensed for right. Metatarsal binder dispensed for both feet. Continue with stretch exercise for the tight Achilles tendon right.

## 2014-09-25 NOTE — Patient Instructions (Signed)
Seen for right heel pain. Continue with stretch exercise and ankle brace. Return as needed.

## 2014-11-21 ENCOUNTER — Encounter: Payer: Self-pay | Admitting: Pediatrics

## 2014-11-21 ENCOUNTER — Ambulatory Visit (INDEPENDENT_AMBULATORY_CARE_PROVIDER_SITE_OTHER): Payer: 59 | Admitting: Pediatrics

## 2014-11-21 ENCOUNTER — Encounter (INDEPENDENT_AMBULATORY_CARE_PROVIDER_SITE_OTHER): Payer: Self-pay

## 2014-11-21 VITALS — BP 128/87 | HR 96 | Temp 97.6°F | Ht 61.0 in | Wt 200.8 lb

## 2014-11-21 DIAGNOSIS — F32A Depression, unspecified: Secondary | ICD-10-CM

## 2014-11-21 DIAGNOSIS — E669 Obesity, unspecified: Secondary | ICD-10-CM | POA: Diagnosis not present

## 2014-11-21 DIAGNOSIS — E282 Polycystic ovarian syndrome: Secondary | ICD-10-CM | POA: Insufficient documentation

## 2014-11-21 DIAGNOSIS — F329 Major depressive disorder, single episode, unspecified: Secondary | ICD-10-CM

## 2014-11-21 DIAGNOSIS — G43009 Migraine without aura, not intractable, without status migrainosus: Secondary | ICD-10-CM | POA: Diagnosis not present

## 2014-11-21 MED ORDER — METFORMIN HCL 500 MG PO TABS: 500.0000 mg | ORAL_TABLET | Freq: Two times a day (BID) | ORAL | Status: DC

## 2014-11-21 MED ORDER — VENLAFAXINE HCL ER 150 MG PO CP24: 150.0000 mg | ORAL_CAPSULE | Freq: Every day | ORAL | Status: DC

## 2014-11-21 NOTE — Progress Notes (Signed)
Subjective:    Patient ID: [name], female    DOB: Oct 16, 1980, 34 y.o.   MRN: [id number]  HPI: [name] is a 34 y.o. female presenting on 11/21/2014 for depression follow up and to establish care.  She describes as situational depression from trouble husband has been going through. Things are going better now, on the venlafaxine. She thinks venlafaxine has been helping, had new social situation that was upsetting over the last few weeks. She did have thoughts about staying in [place] on a recent work trip but did not stay. Denies thoughts of hurting herself or anyone else. Has seen counselors in the past, not seeing one now. Says depression was fairly well controlled until the last month when more situational problems came up with [employer].  71 and 36 yo girls.   Gets migraines that she thinks are stress related.   Has foot pain she's been told is related to her weight.   Irregular periods, has 3 a year. Also with extra hair and acne. Had a uterine polyp, had to have a D&C.  Recently got bifocals, no vision changes since.  Has been on metformin in the past from OB/gyn for PCOS she says was started to help her get pregnant.  Relevant past medical, surgical, family and social history reviewed and updated as indicated. Interim medical history since our last visit reviewed. Allergies and medications reviewed and updated.   ROS: All systems negative other than what is in HPI.  Past Medical History Patient Active Problem List   Diagnosis Date Noted  . PCOS (polycystic ovarian syndrome) 11/21/2014  . Metatarsal deformity 09/13/2014  . 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 OSA (obstructive sleep apnea) 06/27/2012  . Right wrist pain 12/07/2011  . Migraine without aura 10/12/2011  . Depression 10/12/2011  . Obesity 10/12/2011  . Plantar fasciitis 11/05/2010  . Pes planus 09/04/2010  . Ankle pain 09/04/2010    Current Outpatient Prescriptions  Medication  Sig Dispense Refill  . acetaminophen (TYLENOL) 500 MG tablet Take 1,000 mg by mouth every 6 (six) hours as needed for pain.    Marland Kitchen venlafaxine XR (EFFEXOR-XR) 150 MG 24 hr capsule Take 1 capsule (150 mg total) by mouth daily. 90 capsule 3  . metFORMIN (GLUCOPHAGE) 500 MG tablet Take 1 tablet (500 mg total) by mouth 2 (two) times daily with a meal. 180 tablet 3  . nabumetone (RELAFEN) 500 MG tablet Take 1 tablet (500 mg total) by mouth 2 (two) times daily. (Patient not taking: Reported on 11/21/2014) 60 tablet 1   No current facility-administered medications for this visit.       Objective:    BP 128/87 mmHg  Pulse 96  Temp(Src) 97.6 F (36.4 C) (Oral)  Ht 5\' 1"  (1.549 m)  Wt 200 lb 12.8 oz (91.082 kg)  BMI 37.96 kg/m2  Wt Readings from Last 3 Encounters:  11/21/14 200 lb 12.8 oz (91.082 kg)  09/13/14 192 lb (87.091 kg)  05/24/14 192 lb (87.091 kg)     Gen: NAD, alert, cooperative with exam EYES: EOMI, no scleral injection or icterus ENT:  OP without erythema LYMPH: no cervical LAD CV: slightly tachycardic , regular rhythm. normal S1/S2, no murmur, DP pulses 2+ b/l, no edema Resp: CTABL, no wheezes, normal WOB Abd: +BS, soft, NTND. no guarding Neuro: Alert and oriented, strength equal b/l UE and LE, coodrination grossly normal MSK: normal muscle bulk PSYCH: +depressed mood. Denies thoughts of hurting herself or others.  Assessment & Plan:   Ms. Abbett was seen today for follow up of depression and other medical conditions and to establish care.  Depression Recent social problems at home have worsened mood recently, continue venlafaxine, recommended going back to counselor with recent worsening of mood. She thinks situation in some ways will continue to improve and has a hopeful outlook on it. Denies thoughts of hurting self or others. She has good support system with husband and her mom. She says she would come back in to clinic or tell someone if symptoms start getting  worse. Gave number for crisis center. Come back in 4 weeks or sooner if getting worse.  Obesity Limited walking due to foot pain, followed by podiatry. Restart foot exercises for strengthening so that she can get back to walking. Keep eating lots of fruits and vegetables. Restart metformin she has at home.  PCOS (polycystic ovarian syndrome) Pt with hyperandorgenism with cystic acne, facial hair. Also with apprx 3 periods a year. Has not had an ovarian u/s before that she knows of. Was put on metformin within the last year by her gynecologist. She is interested in having another baby, not currently on OCP with estrogen.  Migraine without aura Symptoms stable, improved with effexor.     Follow up plan: Return in about 4 weeks (around 12/19/2014).  Assunta Found, MD [facility] 11/21/2014, 9:26 AM

## 2014-11-21 NOTE — Patient Instructions (Signed)
24 hour a day CRISIS NUMBER: [phone]   List of local counseling services:  [facility]- Therapist [address] [place] ,[place] [address] [phone] Children limited to anxiety and depression- NO ADD/ADHD Does not accept Medicaid  [facility] [address] [place], [place] Does see children Does accept medicaid Will assess for Autism but not treat  [facility] [place]. [address] [place] [phone] Does see children  Does accept Medicaid Medication management- substance abuse- bipolar- grief- family-marriage- OCD- Anxiety- PTSD  [facility] Does see children Does accept medicaid They do perform psychological testing  [place] [address] [place] Schedule through [place]. [phone] Patient must call and make own appointment Does se children Does accept Medicaid  [facility] [place], [place] 7-10 accompanied by an adult, 11 and up by themselves Does accept Medicaid Will see patients with- substance abuse-ADHD-ADD-Bipolar-Domestic violence-Marriage counseling- Family Counseling and sexual abuse  [place] and Psychiatrist [address] [place] [phone] Does see children Does accept [facility] [place] [phone]  Dr. Sabra Heck-  Psychiatrist [address], [place] Specializes in ADHD and addictions They do ADHD testing Suboxone clinic  [facility] [address] [place] Does Child psychological testing  [facility] [address] Dr. Dellia [place] [phone] Does Accept Medicaid Evaluates for Autism  [facility] [place] [phone] Does Not accept Medicaid Does do adult ADD evaluations  Dr. Lorenza Evangelist [address] [place] [phone] Does not Take Medicaid Sees ADD and ADHD for treatment      [facility] [address], [place] [address] [phone] Takes Medicaid WIll see children as young as 4

## 2014-11-21 NOTE — Assessment & Plan Note (Signed)
Symptoms stable, improved with effexor.

## 2014-11-21 NOTE — Assessment & Plan Note (Addendum)
Pt with hyperandorgenism with cystic acne, facial hair. Also with apprx 3 periods a year. Has not had an ovarian u/s before that she knows of. Was put on metformin within the last year by her gynecologist. She is interested in having another baby, not currently on OCP with estrogen.

## 2014-11-21 NOTE — Assessment & Plan Note (Addendum)
Recent social problems at home have worsened mood recently, continue venlafaxine, recommended going back to counselor with recent worsening of mood. She thinks situation in some ways will continue to improve and has a hopeful outlook on it. Denies thoughts of hurting self or others. She has good support system with husband and her mom. She says she would come back in to clinic or tell someone if symptoms start getting worse. Gave number for crisis center. Come back in 4 weeks or sooner if getting worse.

## 2014-11-21 NOTE — Assessment & Plan Note (Addendum)
Limited walking due to foot pain, followed by podiatry. Restart foot exercises for strengthening so that she can get back to walking. Keep eating lots of fruits and vegetables. Restart metformin she has at home.

## 2014-12-19 ENCOUNTER — Ambulatory Visit (INDEPENDENT_AMBULATORY_CARE_PROVIDER_SITE_OTHER): Payer: 59 | Admitting: Pediatrics

## 2014-12-19 ENCOUNTER — Encounter: Payer: Self-pay | Admitting: Pediatrics

## 2014-12-19 VITALS — BP 122/81 | HR 93 | Temp 97.6°F | Ht 61.0 in | Wt 201.4 lb

## 2014-12-19 DIAGNOSIS — Z6838 Body mass index (BMI) 38.0-38.9, adult: Secondary | ICD-10-CM | POA: Diagnosis not present

## 2014-12-19 DIAGNOSIS — F329 Major depressive disorder, single episode, unspecified: Secondary | ICD-10-CM

## 2014-12-19 DIAGNOSIS — F32A Depression, unspecified: Secondary | ICD-10-CM

## 2014-12-19 DIAGNOSIS — E282 Polycystic ovarian syndrome: Secondary | ICD-10-CM | POA: Diagnosis not present

## 2014-12-19 NOTE — Assessment & Plan Note (Signed)
Has gained 15 lbs over last year. Going to start walking in the morning after dropping kids off at school. Thinking about weight watchers. Heart disease runs in family.

## 2014-12-19 NOTE — Assessment & Plan Note (Signed)
On venlafaxine. Feels like symptoms are well controlled. Continue med. Will let me know if things worsen.

## 2014-12-19 NOTE — Assessment & Plan Note (Signed)
Given progesterone by gyn recently. She says she and husband are open to having another child, not using birth control, not going to use hormones to help with ovulation.

## 2014-12-19 NOTE — Progress Notes (Signed)
    Subjective:    Patient ID: [name], female    DOB: [birth year], 34 y.o.   MRN: [id number]  CC: f/u depression  HPI: [name] is a 34 y.o. female presenting on 12/19/2014 for Follow-up  Feels like mood has been better since last visit. Kids doing well in school.  Husband has been doing well. She is feeling very tired much of the time, drops kids off at school then will go back to sleep. No regular exercise Eating convenient foods She has not seen counselor, does think she is doing better, feels safe at home.   Relevant past medical, surgical, family and social history reviewed and updated as indicated. Interim medical history since our last visit reviewed. Allergies and medications reviewed and updated.   ROS: Per HPI unless specifically indicated above  Past Medical History Patient Active Problem List   Diagnosis Date Noted  . PCOS (polycystic ovarian syndrome) 11/21/2014  . Metatarsal deformity 09/13/2014  . 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 OSA (obstructive sleep apnea) 06/27/2012  . Right wrist pain 12/07/2011  . Migraine without aura 10/12/2011  . Depression 10/12/2011  . BMI 38.0-38.9,adult 10/12/2011  . Plantar fasciitis 11/05/2010  . Pes planus 09/04/2010  . Ankle pain 09/04/2010    Current Outpatient Prescriptions  Medication Sig Dispense Refill  . acetaminophen (TYLENOL) 500 MG tablet Take 1,000 mg by mouth every 6 (six) hours as needed for pain.    . metFORMIN (GLUCOPHAGE) 500 MG tablet Take 1 tablet (500 mg total) by mouth 2 (two) times daily with a meal. 180 tablet 3  . nabumetone (RELAFEN) 500 MG tablet Take 1 tablet (500 mg total) by mouth 2 (two) times daily. 60 tablet 1  . progesterone (PROMETRIUM) 100 MG capsule Take 100 mg by mouth daily.    Marland Kitchen venlafaxine XR (EFFEXOR-XR) 150 MG 24 hr capsule Take 1 capsule (150 mg total) by mouth daily. 90 capsule 3   No current facility-administered medications for this visit.       Objective:    BP 122/81 mmHg  Pulse 93  Temp(Src) 97.6 F (36.4 C) (Oral)  Ht 5\' 1"  (1.549 m)  Wt 201 lb 6.4 oz (91.354 kg)  BMI 38.07 kg/m2  Wt Readings from Last 3 Encounters:  12/19/14 201 lb 6.4 oz (91.354 kg)  11/21/14 200 lb 12.8 oz (91.082 kg)  09/13/14 192 lb (87.091 kg)     Gen: NAD, alert, cooperative with exam, NCAT EYES: EOMI, no scleral injection or icterus CV: NRRR, normal S1/S2, no murmur Resp: CTABL, no wheezes, normal WOB Ext: No edema, warm Neuro: Alert and oriented Psych: full affect, no SI/HI     Assessment & Plan:   Zaila was seen today for follow-up.  BMI 38.0-38.9,adult Has gained 15 lbs over last year. Going to start walking in the morning after dropping kids off at school. Thinking about weight watchers. Heart disease runs in family. Discussed healthy food choices.  Depression On venlafaxine. Feels like symptoms are well controlled. Continue med. Will let me know if things worsen.  PCOS (polycystic ovarian syndrome) Given progesterone by gyn recently. She says she and husband are open to having another child, not using birth control, not going to use hormones to help with ovulation.   Follow up plan: Return in about 8 months (around 08/19/2015).  Assunta Found, MD [facility] 12/19/2014, 10:21 AM

## 2015-03-21 ENCOUNTER — Encounter: Payer: Self-pay | Admitting: Pediatrics

## 2015-03-21 ENCOUNTER — Ambulatory Visit (INDEPENDENT_AMBULATORY_CARE_PROVIDER_SITE_OTHER): Payer: 59 | Admitting: Pediatrics

## 2015-03-21 ENCOUNTER — Ambulatory Visit: Payer: 59 | Admitting: Pediatrics

## 2015-03-21 VITALS — BP 106/74 | HR 97 | Temp 98.6°F | Ht 61.0 in | Wt 189.4 lb

## 2015-03-21 DIAGNOSIS — J209 Acute bronchitis, unspecified: Secondary | ICD-10-CM

## 2015-03-21 DIAGNOSIS — R6889 Other general symptoms and signs: Secondary | ICD-10-CM

## 2015-03-21 LAB — POCT INFLUENZA A/B
INFLUENZA B, POC: NEGATIVE
Influenza A, POC: NEGATIVE

## 2015-03-21 MED ORDER — AZITHROMYCIN 250 MG PO TABS: ORAL_TABLET | ORAL | Status: DC

## 2015-03-21 MED FILL — AZITHROMYCIN 250 MG TABLET: 250 | 5 days supply | Qty: 6 | Fill #0

## 2015-03-21 NOTE — Progress Notes (Signed)
Subjective:    Patient ID: [name], female    DOB: 11/08/1980, 35 y.o.   MRN: [id number]  CC: Cough; Nasal Congestion; Generalized Body Aches   HPI: [name] is a 35 y.o. female presenting for Cough; Nasal Congestion; Generalized Body Aches  For past 2 weeks has had runny nose and body aches, doesn't think she has any fevers, now coughing still still with congestion   Depression screen [facility] 2/9 03/21/2015 12/19/2014 11/21/2014  Decreased Interest 0 0 2  Down, Depressed, Hopeless 0 0 2  PHQ - 2 Score 0 0 4  Altered sleeping - - 0  Tired, decreased energy - - 3  Change in appetite - - 0  Feeling bad or failure about yourself  - - 3  Trouble concentrating - - 2  Moving slowly or fidgety/restless - - 0  Suicidal thoughts - - 2  PHQ-9 Score - - 14  Difficult doing work/chores - - Somewhat difficult     Relevant past medical, surgical, family and social history reviewed and updated as indicated. Interim medical history since our last visit reviewed. Allergies and medications reviewed and updated.    ROS: Per HPI unless specifically indicated above  History  Smoking status  . Never Smoker   Smokeless tobacco  . Never Used    Past Medical History Patient Active Problem List   Diagnosis Date Noted  . PCOS (polycystic ovarian syndrome) 11/21/2014  . Metatarsal deformity 09/13/2014  . 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 OSA (obstructive sleep apnea) 06/27/2012  . Right wrist pain 12/07/2011  . Migraine without aura 10/12/2011  . Depression 10/12/2011  . BMI 38.0-38.9,adult 10/12/2011  . Plantar fasciitis 11/05/2010  . Pes planus 09/04/2010  . Ankle pain 09/04/2010    Current Outpatient Prescriptions  Medication Sig Dispense Refill  . acetaminophen (TYLENOL) 500 MG tablet Take 1,000 mg by mouth every 6 (six) hours as needed for pain.    . metFORMIN (GLUCOPHAGE) 500 MG tablet Take 1 tablet (500 mg total) by mouth 2 (two) times daily with  a meal. 180 tablet 3  . nabumetone (RELAFEN) 500 MG tablet Take 1 tablet (500 mg total) by mouth 2 (two) times daily. 60 tablet 1  . progesterone (PROMETRIUM) 100 MG capsule Take 100 mg by mouth daily.    Marland Kitchen venlafaxine XR (EFFEXOR-XR) 150 MG 24 hr capsule Take 1 capsule (150 mg total) by mouth daily. 90 capsule 3  . azithromycin (ZITHROMAX) 250 MG tablet Take 2 the first day and then one each day after. 6 tablet 0   No current facility-administered medications for this visit.       Objective:    BP 106/74 mmHg  Pulse 97  Temp(Src) 98.6 F (37 C) (Oral)  Ht 5\' 1"  (1.549 m)  Wt 189 lb 6.4 oz (85.911 kg)  BMI 35.81 kg/m2  Wt Readings from Last 3 Encounters:  03/21/15 189 lb 6.4 oz (85.911 kg)  12/19/14 201 lb 6.4 oz (91.354 kg)  11/21/14 200 lb 12.8 oz (91.082 kg)     Gen: NAD, alert, cooperative with exam, NCAT EYES: EOMI, no scleral injection or icterus ENT:  TMs dull gray b/l, OP with mild erythema LYMPH: no cervical LAD CV: NRRR, normal S1/S2, no murmur, distal pulses 2+ b/l Resp: CTABL, no wheezes, normal WOB Abd: +BS, soft, NTND. no guarding or organomegaly Ext: No edema, warm Neuro: Alert and oriented, strength equal b/l UE and LE, coordination grossly normal MSK: normal  muscle bulk     Assessment & Plan:    Damarie was seen today for cough, nasal congestion, generalized body aches, flu negative, has been coughing for 2 weeks. Will treat for acute bronchitis with azithromycin.  Diagnoses and all orders for this visit:  Flu-like symptoms -     POCT Influenza A/B  Acute bronchitis, unspecified organism -     azithromycin (ZITHROMAX) 250 MG tablet; Take 2 the first day and then one each day after.     Follow up plan: As neded  Assunta Found, MD [facility] 03/21/2015, 12:02 PM

## 2015-03-21 NOTE — Patient Instructions (Signed)
Ibuprofen Sinus rinses

## 2015-04-08 MED FILL — VENLAFAXINE HCL ER 150 MG C: 150 | 90 days supply | Qty: 90 | Fill #1

## 2015-08-02 ENCOUNTER — Emergency Department (HOSPITAL_COMMUNITY)
Admission: EM | Admit: 2015-08-02 | Discharge: 2015-08-02 | Disposition: A | Discharge status: Home / Self Care | Payer: 59 | Attending: Emergency Medicine | Admitting: Emergency Medicine

## 2015-08-02 ENCOUNTER — Encounter (HOSPITAL_COMMUNITY): Payer: Self-pay | Admitting: Emergency Medicine

## 2015-08-02 DIAGNOSIS — Z79899 Other long term (current) drug therapy: Secondary | ICD-10-CM | POA: Insufficient documentation

## 2015-08-02 DIAGNOSIS — Z7984 Long term (current) use of oral hypoglycemic drugs: Secondary | ICD-10-CM | POA: Insufficient documentation

## 2015-08-02 DIAGNOSIS — F329 Major depressive disorder, single episode, unspecified: Secondary | ICD-10-CM | POA: Insufficient documentation

## 2015-08-02 DIAGNOSIS — G43809 Other migraine, not intractable, without status migrainosus: Secondary | ICD-10-CM | POA: Diagnosis not present

## 2015-08-02 DIAGNOSIS — G43909 Migraine, unspecified, not intractable, without status migrainosus: Secondary | ICD-10-CM | POA: Diagnosis not present

## 2015-08-02 MED ORDER — ONDANSETRON HCL 4 MG/2ML IJ SOLN
4.0000 mg | Freq: Once | INTRAMUSCULAR | Status: AC
  Administered 2015-08-02: 4 mg via INTRAVENOUS
  Filled 2015-08-02: qty 2

## 2015-08-02 MED ORDER — PROCHLORPERAZINE EDISYLATE 5 MG/ML IJ SOLN
10.0000 mg | Freq: Once | INTRAMUSCULAR | Status: AC
  Administered 2015-08-02: 10 mg via INTRAVENOUS
  Filled 2015-08-02: qty 2

## 2015-08-02 MED ORDER — SODIUM CHLORIDE 0.9 % IV BOLUS (SEPSIS)
1000.0000 mL | Freq: Once | INTRAVENOUS | Status: AC
  Administered 2015-08-02: 1000 mL via INTRAVENOUS

## 2015-08-02 MED ORDER — DIPHENHYDRAMINE HCL 50 MG/ML IJ SOLN
25.0000 mg | Freq: Once | INTRAMUSCULAR | Status: AC
  Administered 2015-08-02: 25 mg via INTRAVENOUS
  Filled 2015-08-02: qty 1

## 2015-08-02 MED ORDER — KETOROLAC TROMETHAMINE 30 MG/ML IJ SOLN
30.0000 mg | Freq: Once | INTRAMUSCULAR | Status: AC
  Administered 2015-08-02: 30 mg via INTRAVENOUS
  Filled 2015-08-02: qty 1

## 2015-08-02 NOTE — ED Provider Notes (Signed)
7:13 AM Patient signed out to me at shift change. Patient with migraine, woke up from sleep at 3 AM. Typical for her migraine. She was treated with Toradol, Zofran, IV fluids, Benadryl. I reassessed the patient, she states that she still having a headache. She would like something else to help with her pain. We'll try Compazine  8:15 AM Pt feels better after compazine. Will dc home. Follow up with pcp as needed. Return precautions discussed.   Filed Vitals:   08/02/15 0512 08/02/15 0515 08/02/15 0530 08/02/15 0744  BP: 130/75 128/86 129/82 122/73  Pulse: 88 85 82 91  Temp: 98.1 F (36.7 C)     TempSrc: Oral     Resp: 20   20  Height:      Weight:      SpO2: 100% 100% 97% 98%     Jeannett Senior, PA-C 08/02/15 0817  Daleen Bo, MD 08/03/15 1627

## 2015-08-02 NOTE — Discharge Instructions (Signed)

## 2015-08-02 NOTE — ED Notes (Signed)
Woke up from sleep at 0300 with migraine.  Reports taking pamprin and vomiting it up.

## 2015-08-02 NOTE — ED Provider Notes (Signed)
CSN: [id number]     Arrival date & time 08/02/15  [id number] History   First MD Initiated Contact with Patient 08/02/15 0435     Chief Complaint  Patient presents with  . Migraine     (Consider location/radiation/quality/duration/timing/severity/associated sxs/prior Treatment) HPI   Patient with a PMH of migraines, depression and anxiety  Patient has hx of migraines that were felt over her left eye and frontal region. She woke up from 0300 with the migraine and photophobia. She says she has not had a migraine in a long time since she started taking Effexor and did not have any home medication for migraine. She tried taking Pamprin but vomited it back up. Denies fever, neck pain, change in vision, confusion, diaphoresis, fever,  weakness (general or focal), change of vision,  neck pain, dysphagia, aphagia, chest pain, shortness of breath,  back pain, abdominal pains, diarrhea, lower extremity swelling, rash. Pain currently a 8/10. [name] is a 35 y.o. female  PCP: Eustaquio Maize, MD  Blood pressure 129/82, pulse 82, temperature 98.1 F (36.7 C), temperature source Oral, resp. rate 20, height 5\' 1"  (1.549 m), weight 86.183 kg, last menstrual period 08/02/2015, SpO2 97 %.    Past Medical History  Diagnosis Date  . Migraines   . Depression   . Anxiety    Past Surgical History  Procedure Laterality Date  . Tooth extraction    . Dnc     Family History  Problem Relation Age of Onset  . Hypertension Father   . Sleep apnea Father   . Diabetes Father   . Kidney Stones Father   . Breast cancer Paternal Grandmother   . Hypertension Mother   . Diabetes Mother   . Lupus Mother   . Lupus Other     maternal side--neice  . Heart disease Father     bypass  . COPD Father   . Hyperlipidemia Father   . Hyperlipidemia Mother    Social History  Substance Use Topics  . Smoking status: Never Smoker   . Smokeless tobacco: Never Used  . Alcohol Use: 0.0 oz/week    0 Standard  drinks or equivalent per week     Comment: socially   OB History    No data available     Review of Systems  Review of Systems All other systems negative except as documented in the HPI. All pertinent positives and negatives as reviewed in the HPI.   Allergies  Nitrofurantoin monohyd macro and Cephalexin  Home Medications   Prior to Admission medications   Medication Sig Start Date End Date Taking? Authorizing Provider  ranitidine (ZANTAC) 150 MG tablet Take 150 mg by mouth 2 (two) times daily.   Yes Historical Provider, MD  venlafaxine XR (EFFEXOR-XR) 150 MG 24 hr capsule Take 1 capsule (150 mg total) by mouth daily. 11/21/14  Yes Eustaquio Maize, MD  azithromycin (ZITHROMAX) 250 MG tablet Take 2 the first day and then one each day after. Patient not taking: Reported on 08/02/2015 03/21/15   Eustaquio Maize, MD  metFORMIN (GLUCOPHAGE) 500 MG tablet Take 1 tablet (500 mg total) by mouth 2 (two) times daily with a meal. Patient not taking: Reported on 08/02/2015 11/21/14   Eustaquio Maize, MD  nabumetone (RELAFEN) 500 MG tablet Take 1 tablet (500 mg total) by mouth 2 (two) times daily. Patient not taking: Reported on 08/02/2015 09/13/14   Myeong O Sheard, DPM   BP 129/82 mmHg  Pulse 82  Temp(Src) 98.1 F (36.7 C) (Oral)  Resp 20  Ht 5\' 1"  (1.549 m)  Wt 86.183 kg  BMI 35.92 kg/m2  SpO2 97%  LMP 08/02/2015 (Exact Date) Physical Exam  Constitutional: She appears well-developed and well-nourished. No distress.  HENT:  Head: Normocephalic and atraumatic.  Right Ear: Tympanic membrane and ear canal normal.  Left Ear: Tympanic membrane and ear canal normal.  Nose: Nose normal.  Mouth/Throat: Uvula is midline, oropharynx is clear and moist and mucous membranes are normal.  Eyes: Pupils are equal, round, and reactive to light.  Neck: Normal range of motion. Neck supple.  Cardiovascular: Normal rate and regular rhythm.   Pulmonary/Chest: Effort normal.  Abdominal: Soft.  No signs of  abdominal distention  Musculoskeletal:  No LE swelling  Neurological: She is alert.  Cranial nerves grossly intact on exam. Pt alert and oriented x 3 Upper and lower extremity strength is symmetrical and physiologic Normal muscular tone No facial droop Coordination intact, no limb ataxia  Skin: Skin is warm and dry. No rash noted.  Nursing note and vitals reviewed.   ED Course  Procedures (including critical care time) Labs Review Labs Reviewed - No data to display  Imaging Review No results found. I have personally reviewed and evaluated these images and lab results as part of my medical decision-making.   EKG Interpretation None      MDM   Final diagnoses:  Other migraine without status migrainosus, not intractable   Medications  sodium chloride 0.9 % bolus 1,000 mL (1,000 mLs Intravenous New Bag/Given 08/02/15 0445)  ondansetron (ZOFRAN) injection 4 mg (4 mg Intravenous Given 08/02/15 0446)  diphenhydrAMINE (BENADRYL) injection 25 mg (25 mg Intravenous Given 08/02/15 0544)  ketorolac (TORADOL) 30 MG/ML injection 30 mg (30 mg Intravenous Given 08/02/15 0544)   The patients pain improved to 4/10 before initiation of saline. Benadryl and Toradol added  At end of shift patient sign out to [employer] PA-C. When patients pain has improved dispo accordingly.      Delos Haring, PA-C 08/02/15 [place], DO 08/02/15 [phone]

## 2015-08-04 MED FILL — metFORMIN HCL 500 MG TABS: 500 | 30 days supply | Qty: 60 | Fill #2

## 2015-08-04 MED FILL — VENLAFAXINE HCL ER 150 MG C: 150 | 90 days supply | Qty: 90 | Fill #2

## 2015-09-28 DIAGNOSIS — H5213 Myopia, bilateral: Secondary | ICD-10-CM | POA: Diagnosis not present

## 2015-10-30 ENCOUNTER — Ambulatory Visit: Payer: 59 | Admitting: Pediatrics

## 2015-11-03 ENCOUNTER — Encounter: Payer: Self-pay | Admitting: Pediatrics

## 2015-11-03 ENCOUNTER — Ambulatory Visit (INDEPENDENT_AMBULATORY_CARE_PROVIDER_SITE_OTHER): Payer: 59 | Admitting: Pediatrics

## 2015-11-03 VITALS — BP 124/90 | HR 61 | Temp 98.0°F | Ht 61.0 in | Wt 199.4 lb

## 2015-11-03 DIAGNOSIS — E669 Obesity, unspecified: Secondary | ICD-10-CM | POA: Diagnosis not present

## 2015-11-03 DIAGNOSIS — F32A Depression, unspecified: Secondary | ICD-10-CM

## 2015-11-03 DIAGNOSIS — F329 Major depressive disorder, single episode, unspecified: Secondary | ICD-10-CM | POA: Diagnosis not present

## 2015-11-03 DIAGNOSIS — E66812 Obesity, class 2: Secondary | ICD-10-CM

## 2015-11-03 DIAGNOSIS — E282 Polycystic ovarian syndrome: Secondary | ICD-10-CM | POA: Diagnosis not present

## 2015-11-03 DIAGNOSIS — G4733 Obstructive sleep apnea (adult) (pediatric): Secondary | ICD-10-CM

## 2015-11-03 DIAGNOSIS — K219 Gastro-esophageal reflux disease without esophagitis: Secondary | ICD-10-CM | POA: Diagnosis not present

## 2015-11-03 MED ORDER — OMEPRAZOLE 20 MG PO CPDR: 20.0000 mg | DELAYED_RELEASE_CAPSULE | Freq: Every day | ORAL | 3 refills | Status: DC

## 2015-11-03 MED ORDER — VENLAFAXINE HCL ER 150 MG PO CP24: 150.0000 mg | ORAL_CAPSULE | Freq: Every day | ORAL | 3 refills | Status: DC

## 2015-11-03 MED FILL — VENLAFAXINE HCL ER 150 MG C: 150 | 30 days supply | Qty: 90 | Fill #0

## 2015-11-03 MED FILL — OMEPRAZOLE DR 20 MG CAPSULE: 20 | 30 days supply | Qty: 30 | Fill #0

## 2015-11-03 NOTE — Progress Notes (Signed)
  Subjective:   Patient ID: [name], female    DOB: 03-08-1981, 35 y.o.   MRN: [id number] CC: Depression (followup)  HPI: [name] is a 35 y.o. female presenting for Depression (followup)  Mood has been good On venlafaxine once a day Moved in with parents for a few months over the winter Out now, back home Dad's health better  No regularity to periods Not taking metformin regularly Has PCOS  Dad with MIs in 50s Occasionally has chest pain, off and on, central, behind R breast or in upper back No pain with exertion Comes on with sitting Can be before lunch or after lunch  BMI elevated: Not much physical activity Drinking lots of sodas  Relevant past medical, surgical, family and social history reviewed. Allergies and medications reviewed and updated. History  Smoking Status  . Never Smoker  Smokeless Tobacco  . Never Used   ROS: Per HPI   Objective:    BP 124/90 (BP Location: Right Arm, Patient Position: Sitting, Cuff Size: Large)   Pulse 61   Temp 98 F (36.7 C) (Oral)   Ht _0  (1.549 m)   Wt 199 lb 6.4 oz (90.4 kg)   BMI 37.68 kg/m   Wt Readings from Last 3 Encounters:  11/03/15 199 lb 6.4 oz (90.4 kg)  08/02/15 190 lb (86.2 kg)  03/21/15 189 lb 6.4 oz (85.9 kg)     Gen: NAD, alert, cooperative with exam, NCAT EYES: EOMI, no conjunctival injection, or no icterus LYMPH: no cervical LAD Neck: nl thyroid CV: NRRR, normal S1/S2, no murmur, distal pulses 2+ b/l Resp: CTABL, no wheezes, normal WOB Abd: +BS, soft, NTND. no guarding or organomegaly Ext: No edema, warm Neuro: Alert and oriented, strength equal b/l UE and LE, coordination grossly normal MSK: normal muscle bulk Psych: normal affect  Assessment & Plan:  [name] was seen today for depression, med problem f/u  Diagnoses and all orders for this visit:  Gastroesophageal reflux disease, esophagitis presence not specified -     omeprazole (PRILOSEC) 20 MG capsule; Take 1  capsule (20 mg total) by mouth daily. -     CMP14+EGFR  Depression Well controlled, cont current meds -     venlafaxine XR (EFFEXOR-XR) 150 MG 24 hr capsule; Take 1 capsule (150 mg total) by mouth daily.  Obesity, Class II, BMI 35-39.9 Cont lifestyle changes, decrease snacks, increase physical activity  PCOS (polycystic ovarian syndrome) Cont metformin  OSA (obstructive sleep apnea) Had sleep study 2014, did not qualify for CPAP  Follow up plan: 77moCA[place]unta Found [place]

## 2015-11-04 LAB — CMP14+EGFR
ALBUMIN: 4.1 g/dL (ref 3.5–5.5)
ALT: 24 IU/L (ref 0–32)
AST: 16 IU/L (ref 0–40)
Albumin/Globulin Ratio: 1.5 (ref 1.2–2.2)
Alkaline Phosphatase: 94 IU/L (ref 39–117)
BUN / CREAT RATIO: 14 (ref 9–23)
BUN: 11 mg/dL (ref 6–20)
Bilirubin Total: 0.3 mg/dL (ref 0.0–1.2)
CALCIUM: 9 mg/dL (ref 8.7–10.2)
CO2: 23 mmol/L (ref 18–29)
CREATININE: 0.76 mg/dL (ref 0.57–1.00)
Chloride: 100 mmol/L (ref 96–106)
GFR calc Af Amer: 116 mL/min/{1.73_m2} (ref >59)
GFR, EST NON AFRICAN AMERICAN: 101 mL/min/{1.73_m2} (ref >59)
GLOBULIN, TOTAL: 2.7 g/dL (ref 1.5–4.5)
GLUCOSE: 120 mg/dL — AB (ref 65–99)
Potassium: 4.2 mmol/L (ref 3.5–5.2)
SODIUM: 139 mmol/L (ref 134–144)
TOTAL PROTEIN: 6.8 g/dL (ref 6.0–8.5)

## 2015-12-01 DIAGNOSIS — Z6836 Body mass index (BMI) 36.0-36.9, adult: Secondary | ICD-10-CM | POA: Diagnosis not present

## 2015-12-01 DIAGNOSIS — R1031 Right lower quadrant pain: Secondary | ICD-10-CM | POA: Diagnosis not present

## 2015-12-01 DIAGNOSIS — Z01419 Encounter for gynecological examination (general) (routine) without abnormal findings: Secondary | ICD-10-CM | POA: Diagnosis not present

## 2015-12-30 DIAGNOSIS — N840 Polyp of corpus uteri: Secondary | ICD-10-CM | POA: Diagnosis not present

## 2015-12-30 MED FILL — HYDROCODON-APAP 5-325: 5-325 | 2 days supply | Qty: 10 | Fill #0

## 2015-12-30 MED FILL — OMEPRAZOLE DR 20 MG CAPSULE: 20 | 30 days supply | Qty: 30 | Fill #1

## 2015-12-30 MED FILL — ALPRAZolam 0.5 MG TABS: 0.5 | 3 days supply | Qty: 10 | Fill #0

## 2016-01-01 DIAGNOSIS — N920 Excessive and frequent menstruation with regular cycle: Secondary | ICD-10-CM | POA: Diagnosis not present

## 2016-01-01 DIAGNOSIS — N840 Polyp of corpus uteri: Secondary | ICD-10-CM | POA: Diagnosis not present

## 2016-01-01 DIAGNOSIS — Z3202 Encounter for pregnancy test, result negative: Secondary | ICD-10-CM | POA: Diagnosis not present

## 2016-01-08 DIAGNOSIS — Z09 Encounter for follow-up examination after completed treatment for conditions other than malignant neoplasm: Secondary | ICD-10-CM | POA: Diagnosis not present

## 2016-02-10 ENCOUNTER — Ambulatory Visit: Payer: 59 | Admitting: Family

## 2016-02-11 ENCOUNTER — Encounter: Payer: Self-pay | Admitting: Pediatrics

## 2016-02-11 ENCOUNTER — Ambulatory Visit (INDEPENDENT_AMBULATORY_CARE_PROVIDER_SITE_OTHER): Payer: 59 | Admitting: Pediatrics

## 2016-02-11 VITALS — BP 134/89 | HR 92 | Temp 98.2°F | Ht 61.0 in | Wt 202.8 lb

## 2016-02-11 DIAGNOSIS — M25521 Pain in right elbow: Secondary | ICD-10-CM | POA: Diagnosis not present

## 2016-02-11 DIAGNOSIS — M542 Cervicalgia: Secondary | ICD-10-CM

## 2016-02-11 MED ORDER — CYCLOBENZAPRINE HCL 5 MG PO TABS: 5.0000 mg | ORAL_TABLET | Freq: Two times a day (BID) | ORAL | 1 refills | Status: DC | PRN

## 2016-02-11 NOTE — Progress Notes (Signed)
  Subjective:   Patient ID: [name], female    DOB: 05-02-1980, 35 y.o.   MRN: [id number] CC: Neck Pain (Right sided) and Arm Pain (Burning Right side)  HPI: [name] is a 35 y.o. female presenting for Neck Pain (Right sided) and Arm Pain (Burning Right side)  Says she has always been tense in neck muscles Gets tension headaches For the past month and then more past two weeks Neck pain R side of back of neck and pain going down R arm over deltoid, doesn't go all the way down to hand  Taking ibuprofen 600mg  1-2 times a day   Relevant past medical, surgical, family and social history reviewed. Allergies and medications reviewed and updated. History  Smoking Status  . Never Smoker  Smokeless Tobacco  . Never Used   ROS: Per HPI   Objective:    BP 134/89   Pulse 92   Temp 98.2 F (36.8 C) (Oral)   Ht 5\' 1"  (1.549 m)   Wt 202 lb 12.8 oz (92 kg)   BMI 38.32 kg/m   Wt Readings from Last 3 Encounters:  02/11/16 202 lb 12.8 oz (92 kg)  11/03/15 199 lb 6.4 oz (90.4 kg)  08/02/15 190 lb (86.2 kg)    Gen: NAD, alert, cooperative with exam, NCAT EYES: EOMI, no conjunctival injection, or no icterus ENT:  TMs pearly gray b/l, OP without erythema LYMPH: no cervical LAD CV: NRRR, normal S1/S2, no murmur Resp: CTABL, no wheezes, normal WOB Neuro: Alert and oriented MSK: TTP soft tissue over upper R shoulder, decreased ROM turning head to R compared with L, tight muscles R posterior neck  Assessment & Plan:  [name] was seen today for neck pain and arm pain.  Diagnoses and all orders for this visit:  Neck pain Arthralgia of right upper arm Discussed gentle ROM Rest, ice, heating pad, NSAIDs prn Trial of below Let me know if not improving -     cyclobenzaprine (FLEXERIL) 5 MG tablet; Take 1-2 tablets (5-10 mg total) by mouth 2 (two) times daily as needed for muscle spasms.  Follow up plan: Return if symptoms worsen or fail to improve. Assunta Found,  MD [place]

## 2016-02-11 NOTE — Patient Instructions (Addendum)
Ibuprofen 600mg  three times a day for the next few days Heating pad Gentle range of motion with your neck Muscle relaxer as needed, can make you sleepy, do not drive after taking Start with 1 tab Take breaks from computer at work to stand and stretch Let me know if not improving over next 2 weeks

## 2016-03-03 ENCOUNTER — Ambulatory Visit (INDEPENDENT_AMBULATORY_CARE_PROVIDER_SITE_OTHER): Payer: Self-pay | Admitting: Nurse Practitioner

## 2016-03-03 VITALS — BP 132/78 | HR 76 | Temp 100.0°F | Resp 20 | Ht 62.0 in | Wt 203.6 lb

## 2016-03-03 DIAGNOSIS — R509 Fever, unspecified: Secondary | ICD-10-CM

## 2016-03-03 LAB — POCT INFLUENZA A/B
INFLUENZA B, POC: NEGATIVE
Influenza A, POC: NEGATIVE

## 2016-03-03 MED FILL — VENTOLIN HFA 90 MCG INHALER: 108 (90 BAS | 25 days supply | Qty: 18 | Fill #0

## 2016-03-03 MED FILL — OSELTAMIVIR PHOS 75 MG CAP: 75 | 5 days supply | Qty: 10 | Fill #0

## 2016-03-04 NOTE — Progress Notes (Signed)
See provider documentation under "Media" tab.  Patient was seen by Apolonio Schneiders, NP

## 2016-03-24 ENCOUNTER — Other Ambulatory Visit: Payer: Self-pay | Admitting: Pediatrics

## 2016-03-24 DIAGNOSIS — M542 Cervicalgia: Secondary | ICD-10-CM

## 2016-04-02 MED FILL — VENLAFAXINE HCL ER 150 MG C: 150 | 30 days supply | Qty: 90 | Fill #1 | Status: TO

## 2016-04-02 MED FILL — OMEPRAZOLE DR 20 MG CAPSULE: 20 | 30 days supply | Qty: 30 | Fill #2

## 2016-05-06 ENCOUNTER — Ambulatory Visit (INDEPENDENT_AMBULATORY_CARE_PROVIDER_SITE_OTHER): Payer: Self-pay | Admitting: Family

## 2016-05-06 ENCOUNTER — Encounter: Payer: Self-pay | Admitting: Family

## 2016-05-06 VITALS — BP 110/80 | HR 96 | Temp 99.3°F | Wt 202.4 lb

## 2016-05-06 DIAGNOSIS — H6502 Acute serous otitis media, left ear: Secondary | ICD-10-CM

## 2016-05-06 DIAGNOSIS — J329 Chronic sinusitis, unspecified: Secondary | ICD-10-CM

## 2016-05-06 DIAGNOSIS — B9689 Other specified bacterial agents as the cause of diseases classified elsewhere: Secondary | ICD-10-CM

## 2016-05-06 MED ORDER — AZITHROMYCIN 250 MG PO TABS: ORAL_TABLET | ORAL | 0 refills | Status: DC

## 2016-05-06 MED ORDER — PREDNISONE 5 MG PO TABS: 5.0000 mg | ORAL_TABLET | ORAL | 0 refills | Status: DC

## 2016-05-06 MED ORDER — GUAIFENESIN ER 600 MG PO TB12: 1200.0000 mg | ORAL_TABLET | Freq: Two times a day (BID) | ORAL | 0 refills | Status: DC

## 2016-05-06 MED ORDER — FLUCONAZOLE 150 MG PO TABS: 150.0000 mg | ORAL_TABLET | Freq: Once | ORAL | 0 refills | Status: AC

## 2016-05-06 MED FILL — FLUCONAZOLE 150 MG TABLET: 150 | 2 days supply | Qty: 2 | Fill #0

## 2016-05-06 MED FILL — AZITHROMYCIN 250 MG TABLET: 250 | 5 days supply | Qty: 6 | Fill #0

## 2016-05-06 MED FILL — predniSONE 5 MG TABS: 5 | 6 days supply | Qty: 21 | Fill #0

## 2016-05-06 NOTE — Progress Notes (Signed)
[name]  Chief Complaint  Patient presents with  . Ear Fullness      ICD-9-CM ICD-10-CM   1. Bacterial sinusitis 473.9 J32.9 predniSONE (DELTASONE) 5 MG tablet   041.9 B96.89 azithromycin (ZITHROMAX) 250 MG tablet     fluconazole (DIFLUCAN) 150 MG tablet     guaiFENesin (MUCINEX) 600 MG 12 hr tablet  2. Acute serous otitis media of left ear, recurrence not specified 381.01 H65.02 predniSONE (DELTASONE) 5 MG tablet     azithromycin (ZITHROMAX) 250 MG tablet    Patient Active Problem List   Diagnosis Date Noted  . Bacterial sinusitis 05/06/2016  . Acute serous otitis media of left ear 05/06/2016  . Obesity, Class II, BMI 35-39.9 11/03/2015  . Esophageal reflux 11/03/2015  . PCOS (polycystic ovarian syndrome) 11/21/2014  . Metatarsal deformity 09/13/2014  . 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 OSA (obstructive sleep apnea) 06/27/2012  . Right wrist pain 12/07/2011  . Migraine without aura 10/12/2011  . Depression 10/12/2011  . BMI 37.0-37.9, adult 10/12/2011  . Plantar fasciitis 11/05/2010  . Pes planus 09/04/2010  . Ankle pain 09/04/2010    Past Medical History:  Diagnosis Date  . Anxiety   . Depression   . Migraines   . PCOS (polycystic ovarian syndrome)     Past Surgical History:  Procedure Laterality Date  . DNC    . TOOTH EXTRACTION      Allergies  Allergen Reactions  . Nitrofurantoin Monohyd Macro Hives and Itching  . Cephalexin Rash    BP 110/80   Pulse 96   Temp 99.3 F (37.4 C) (Oral)   Wt 202 lb 6.4 oz (91.8 kg)   SpO2 98%   BMI 37.02 kg/m   Review of Systems  HENT: Positive for congestion, ear pain, sinus pain and sore throat.   Respiratory: Positive for cough, sputum production and shortness of breath.   All other systems reviewed and are negative.  Physical Exam  Constitutional: She is oriented to person, place, and time. She appears well-developed and well-nourished.  HENT:  Head: Normocephalic.  Right Ear:  External ear and ear canal normal. There is swelling.  Left Ear: External ear and ear canal normal. There is swelling. Tympanic membrane is erythematous and bulging. A middle ear effusion is present. Decreased hearing is noted.  Eyes: Conjunctivae are normal.  Neck: Normal range of motion.  Cardiovascular: Normal rate, regular rhythm and normal heart sounds.   Pulmonary/Chest: Effort normal and breath sounds normal.  Abdominal: Soft. Bowel sounds are normal.  Musculoskeletal: Normal range of motion.  Neurological: She is alert and oriented to person, place, and time.  Skin: Skin is warm and dry.    No results found for this or any previous visit (from the past 72 hour(s)).   Current Outpatient Prescriptions:  .  cyclobenzaprine (FLEXERIL) 5 MG tablet, TAKE 1-2 TABLETS TWICE DAILY AS NEEDED FOR MUSCLE SPASMS, Disp: 30 tablet, Rfl: 2 .  metFORMIN (GLUCOPHAGE) 500 MG tablet, Take 1 tablet (500 mg total) by mouth 2 (two) times daily with a meal., Disp: 180 tablet, Rfl: 3 .  omeprazole (PRILOSEC) 20 MG capsule, Take 1 capsule (20 mg total) by mouth daily., Disp: 30 capsule, Rfl: 3 .  venlafaxine XR (EFFEXOR-XR) 150 MG 24 hr capsule, Take 1 capsule (150 mg total) by mouth daily., Disp: 90 capsule, Rfl: 3 .  azithromycin (ZITHROMAX) 250 MG tablet, Take 2 tablets now then 1 daily times 4 days, Disp: 6 tablet, Rfl: 0 .  fluconazole (DIFLUCAN) 150 MG tablet, Take 1 tablet (150 mg total) by mouth once. And repeat in 3 days if necessary, Disp: 2 tablet, Rfl: 0 .  guaiFENesin (MUCINEX) 600 MG 12 hr tablet, Take 2 tablets (1,200 mg total) by mouth 2 (two) times daily., Disp: 28 tablet, Rfl: 0 .  predniSONE (DELTASONE) 5 MG tablet, Take 1 tablet (5 mg total) by mouth as directed. 21 dose pack taper per pharmacy, Disp: 21 tablet, Rfl: 0  Subjective: "I have been sick for about a week. It started in my sinuses then to my lungs and now my ears. I keep coughing up stuff and my left ear feels full of fluid".    Objective: Pt seen and chart reviewed. Pt is alert/oriented x4, calm, cooperative, and in NAD. Pt reports that she cannot hear very well out of her left ear. Sick x1 week with sinuses, lungs, ears.   Assessment:  -Bacterial Sinusitis -Otitis Media (left)  Plan:  -Sterapred (generic) 5mg  21 dose pack -Zpack -Diflucan 150mg  x1 (hx yeast infections with abx) -Mucinex 1200mg  po bid  Benjamine Mola, FNP 05/06/2016 9:50 AM

## 2016-06-18 MED FILL — VENLAFAXINE HCL ER 150 MG C: 150 | 30 days supply | Qty: 30 | Fill #0 | Status: TO

## 2016-07-29 MED FILL — VENLAFAXINE HCL ER 150 MG C: 150 | 30 days supply | Qty: 30 | Fill #1 | Status: TO

## 2016-08-31 ENCOUNTER — Ambulatory Visit (INDEPENDENT_AMBULATORY_CARE_PROVIDER_SITE_OTHER): Payer: Self-pay | Admitting: Nurse Practitioner

## 2016-08-31 ENCOUNTER — Encounter: Payer: Self-pay | Admitting: Nurse Practitioner

## 2016-08-31 VITALS — BP 108/64 | HR 99 | Temp 98.6°F | Wt 209.4 lb

## 2016-08-31 DIAGNOSIS — L089 Local infection of the skin and subcutaneous tissue, unspecified: Secondary | ICD-10-CM

## 2016-08-31 DIAGNOSIS — B9689 Other specified bacterial agents as the cause of diseases classified elsewhere: Secondary | ICD-10-CM

## 2016-08-31 MED ORDER — MUPIROCIN 2 % EX OINT
1.0000 | TOPICAL_OINTMENT | Freq: Two times a day (BID) | CUTANEOUS | 0 refills | Status: DC
Start: 2016-08-31 — End: 2017-03-25

## 2016-08-31 MED ORDER — CLINDAMYCIN HCL 300 MG PO CAPS: 300.0000 mg | ORAL_CAPSULE | Freq: Two times a day (BID) | ORAL | 0 refills | Status: AC

## 2016-08-31 NOTE — Progress Notes (Signed)
   Subjective:    Patient ID: [name], female    DOB: 1980-07-01, 36 y.o.   MRN: [id number]  [name] is a 36 y.o. female who presents for evaluation of a probable cutaneous abscess. Lesion is located in the right mandible region.  Onset was 5 days ago. Symptoms have linear pain at sight, erythema and mandibular swelling.  Denies fever, diabetes.  Patient she has not picked at the area, but area with black eschar surrounded with erythema.  Patient state she had a prior abscess approximately 3-4 weeks ago, which has resolved after antibiotics.  Patient states she had Bactrim left from previous abscess and got sick from the abx.  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 Patient was referred to dermatology but has been unable to make an appointment.  Since that time second abscess has occurred.  Patient does not have diabetes.   Review of Systems  Constitutional: Negative.   HENT: Negative.   Eyes: Negative.   Respiratory: Negative.   Cardiovascular: Negative.   Skin: Positive for wound.       Right face       Objective:   Physical Exam  Constitutional: She is oriented to person, place, and time. She appears well-developed and well-nourished. She appears distressed.  Mild distress d/t facial swelling and facial pain to affected area.  Eyes: Conjunctivae and EOM are normal. Pupils are equal, round, and reactive to light.  Neck: Normal range of motion. Neck supple.  Cardiovascular: Normal rate, regular rhythm and normal heart sounds.   Pulmonary/Chest: Effort normal and breath sounds normal.  Neurological: She is alert and oriented to person, place, and time.  Skin: Skin is warm and dry. There is erythema.     3cm area of induration to right mandible region with moderate erythema.  Central wound with dark eschar/scabbing, no drainage.  Warm to palpation.            Assessment & Plan:  Right Facial Abscess.  Use antibiotics and antibiotic  ointment as instructed.  Use Ibuprofen 800mg  q 8 hrs for facial pain and inflammation.  Warm compress to affected area.  Do not remove scabbing.  Make sure to wash hands with any manipulation of wound.  Take antibiotics as directed and complete course.  Go to ER if fever, drainage, increasing redness to face.  Recommended follow up with dermatology.  Patient verbalizes understanding.

## 2016-08-31 NOTE — Patient Instructions (Addendum)

## 2016-09-02 ENCOUNTER — Telehealth: Payer: Self-pay

## 2016-11-16 ENCOUNTER — Other Ambulatory Visit: Payer: Self-pay | Admitting: Pediatrics

## 2016-11-16 DIAGNOSIS — F329 Major depressive disorder, single episode, unspecified: Secondary | ICD-10-CM

## 2016-11-16 DIAGNOSIS — F32A Depression, unspecified: Secondary | ICD-10-CM

## 2016-11-18 ENCOUNTER — Other Ambulatory Visit: Payer: Self-pay | Admitting: Pediatrics

## 2016-11-18 DIAGNOSIS — F329 Major depressive disorder, single episode, unspecified: Secondary | ICD-10-CM

## 2016-11-18 DIAGNOSIS — F32A Depression, unspecified: Secondary | ICD-10-CM

## 2016-11-18 MED ORDER — VENLAFAXINE HCL ER 150 MG PO CP24: 150.0000 mg | ORAL_CAPSULE | Freq: Every day | ORAL | 0 refills | Status: DC

## 2016-11-18 NOTE — Telephone Encounter (Signed)
Please review and advise.

## 2016-11-18 NOTE — Telephone Encounter (Signed)
Changed pharmacy

## 2016-11-18 NOTE — Addendum Note (Signed)
Addended by: Antonietta Barcelona D on: 11/18/2016 05:41 PM   Modules accepted: Orders

## 2016-11-18 NOTE — Telephone Encounter (Signed)
Patient notified.  Appt made.

## 2016-12-22 ENCOUNTER — Ambulatory Visit (INDEPENDENT_AMBULATORY_CARE_PROVIDER_SITE_OTHER): Payer: Commercial Managed Care - PPO | Admitting: Pediatrics

## 2016-12-22 ENCOUNTER — Encounter: Payer: Self-pay | Admitting: Pediatrics

## 2016-12-22 ENCOUNTER — Telehealth: Payer: Self-pay | Admitting: Pediatrics

## 2016-12-22 VITALS — BP 121/89 | HR 88 | Temp 97.6°F | Ht 62.0 in | Wt 200.8 lb

## 2016-12-22 DIAGNOSIS — L709 Acne, unspecified: Secondary | ICD-10-CM | POA: Diagnosis not present

## 2016-12-22 DIAGNOSIS — R399 Unspecified symptoms and signs involving the genitourinary system: Secondary | ICD-10-CM | POA: Diagnosis not present

## 2016-12-22 DIAGNOSIS — N3090 Cystitis, unspecified without hematuria: Secondary | ICD-10-CM | POA: Diagnosis not present

## 2016-12-22 DIAGNOSIS — B379 Candidiasis, unspecified: Secondary | ICD-10-CM

## 2016-12-22 LAB — URINALYSIS, COMPLETE
Bilirubin, UA: NEGATIVE
Glucose, UA: NEGATIVE
Ketones, UA: NEGATIVE
Nitrite, UA: NEGATIVE
PH UA: 6 (ref 5.0–7.5)
PROTEIN UA: NEGATIVE
Specific Gravity, UA: 1.02 (ref 1.005–1.030)
UUROB: 0.2 mg/dL (ref 0.2–1.0)

## 2016-12-22 LAB — MICROSCOPIC EXAMINATION: Renal Epithel, UA: NONE SEEN /hpf

## 2016-12-22 MED ORDER — CLINDAMYCIN PHOSPHATE 1 % EX GEL: Freq: Two times a day (BID) | CUTANEOUS | 0 refills | Status: DC

## 2016-12-22 MED ORDER — DOXYCYCLINE HYCLATE 100 MG PO TABS: 100.0000 mg | ORAL_TABLET | Freq: Two times a day (BID) | ORAL | 0 refills | Status: DC

## 2016-12-22 MED ORDER — FLUCONAZOLE 150 MG PO TABS: ORAL_TABLET | ORAL | 0 refills | Status: DC

## 2016-12-22 NOTE — Progress Notes (Signed)
  Subjective:   Patient ID: [name], female    DOB: 1981/01/02, 36 y.o.   MRN: [id number] CC: Medication Refill; Foul Urine Odor; and Back Pain (Lower)  HPI: [name] is a 36 y.o. female presenting for Medication Refill; Foul Urine Odor; and Back Pain (Lower)  No burning with urination Having some b/l lower back pain Some urinary frequency, improved from a few months ago Thinks symptoms ongoing at least 2-3 months Has some urgency Bad smell to urine Drinks a lot of Diet Dr Malachi Bonds, trying to increase water  Sometimes has thick white vaginal discharge  Has had h/o MRSA skin abscess on face Recently has had worsening acne around chin  Relevant past medical, surgical, family and social history reviewed. Allergies and medications reviewed and updated. History  Smoking Status  . Never Smoker  Smokeless Tobacco  . Never Used   ROS: Per HPI   Objective:    BP 121/89   Pulse 88   Temp 97.6 F (36.4 C) (Oral)   Ht 5\' 2"  (1.575 m)   Wt 200 lb 12.8 oz (91.1 kg)   BMI 36.73 kg/m   Wt Readings from Last 3 Encounters:  12/22/16 200 lb 12.8 oz (91.1 kg)  08/31/16 209 lb 6.4 oz (95 kg)  05/06/16 202 lb 6.4 oz (91.8 kg)    Gen: NAD, alert, cooperative with exam, NCAT EYES: EOMI, no conjunctival injection, or no icterus ENT:  OP without erythema LYMPH: no cervical LAD CV: NRRR, normal S1/S2, no murmur Resp: CTABL, no wheezes, normal WOB Abd: +BS, soft, NTND. No CVA tenderness, no guarding or organomegaly Ext: No edema, warm Neuro: Alert and oriented, strength equal b/l UE and LE, coordination grossly normal MSK: normal muscle bulk Skin: several red papules along jawline, chin No flutuance  Assessment & Plan:  [name] was seen today for medication refill, foul urine odor and back pain.  Diagnoses and all orders for this visit:  UTI symptoms UA positive, treat as below -     Urinalysis, Complete -     Urine Culture; Future -     Urine Culture  Acne,  unspecified acne type Discussed skin care Start below -    clindamycin (CLINDAGEL) 1 % gel; Apply topically 2 (two) times daily.  Cystitis UA positive, treat with below, f/u culture -     doxycycline (VIBRA-TABS) 100 MG tablet; Take 1 tablet (100 mg total) by mouth 2 (two) times daily.  Yeast infection Ua with yeast, +symptoms -     fluconazole (DIFLUCAN) 150 MG tablet; Take prn q72h   Follow up plan: Return in about 2 months (around 02/21/2017). Assunta Found, MD [place]

## 2016-12-22 NOTE — Telephone Encounter (Signed)
Patient aware rx resent and cancelled at [facility] outpatient pharmacy

## 2016-12-27 LAB — URINE CULTURE

## 2017-03-21 ENCOUNTER — Other Ambulatory Visit: Payer: Self-pay | Admitting: Pediatrics

## 2017-03-21 DIAGNOSIS — F32A Depression, unspecified: Secondary | ICD-10-CM

## 2017-03-21 DIAGNOSIS — F329 Major depressive disorder, single episode, unspecified: Secondary | ICD-10-CM

## 2017-03-22 NOTE — Telephone Encounter (Signed)
OV 03/24/17

## 2017-03-22 NOTE — Telephone Encounter (Signed)
Closing encounter Refilled from Rx refill pool

## 2017-03-24 ENCOUNTER — Ambulatory Visit: Payer: Commercial Managed Care - PPO | Admitting: Pediatrics

## 2017-03-25 ENCOUNTER — Encounter: Payer: Self-pay | Admitting: Pediatrics

## 2017-03-25 ENCOUNTER — Ambulatory Visit (INDEPENDENT_AMBULATORY_CARE_PROVIDER_SITE_OTHER): Payer: Commercial Managed Care - PPO | Admitting: Pediatrics

## 2017-03-25 VITALS — BP 128/83 | HR 83 | Temp 97.9°F | Ht 62.0 in | Wt 205.6 lb

## 2017-03-25 DIAGNOSIS — E559 Vitamin D deficiency, unspecified: Secondary | ICD-10-CM | POA: Diagnosis not present

## 2017-03-25 DIAGNOSIS — R8281 Pyuria: Secondary | ICD-10-CM

## 2017-03-25 DIAGNOSIS — Z6837 Body mass index (BMI) 37.0-37.9, adult: Secondary | ICD-10-CM | POA: Diagnosis not present

## 2017-03-25 DIAGNOSIS — F329 Major depressive disorder, single episode, unspecified: Secondary | ICD-10-CM | POA: Diagnosis not present

## 2017-03-25 DIAGNOSIS — N390 Urinary tract infection, site not specified: Secondary | ICD-10-CM

## 2017-03-25 DIAGNOSIS — R399 Unspecified symptoms and signs involving the genitourinary system: Secondary | ICD-10-CM

## 2017-03-25 DIAGNOSIS — Z13220 Encounter for screening for lipoid disorders: Secondary | ICD-10-CM | POA: Diagnosis not present

## 2017-03-25 DIAGNOSIS — F32A Depression, unspecified: Secondary | ICD-10-CM

## 2017-03-25 LAB — URINALYSIS, COMPLETE
Bilirubin, UA: NEGATIVE
GLUCOSE, UA: NEGATIVE
Ketones, UA: NEGATIVE
Nitrite, UA: NEGATIVE
PROTEIN UA: NEGATIVE
Specific Gravity, UA: 1.02 (ref 1.005–1.030)
UUROB: 0.2 mg/dL (ref 0.2–1.0)
pH, UA: 7 (ref 5.0–7.5)

## 2017-03-25 LAB — MICROSCOPIC EXAMINATION

## 2017-03-25 LAB — BAYER DCA HB A1C WAIVED: HB A1C (BAYER DCA - WAIVED): 5.1 % (ref <7.0)

## 2017-03-25 NOTE — Progress Notes (Signed)
  Subjective:   Patient ID: [name], female    DOB: September 09, 1980, 37 y.o.   MRN: [id number] CC: Medication Refill; Follow-up (Uti); Back Pain; and Urinary Urgency  HPI: [name] is a 37 y.o. female presenting for Medication Refill; Follow-up (Uti); Back Pain; and Urinary Urgency Was treated for UTI 3 mo ago with doxycycline for klebsiella UTI She isnt not sure if symptoms ever fully went away Having urgency, no dysuria, back pain similar to what she usually has with UTIs More symptoms last few days Foul smelling urine off and on since then  No fevers Appetite has been fine  Depression: Patient would like to get off of the venlafaxine later this spring Some ongoing stress over the next couple of months but she thinks it will be resolved soon  PCOS: Following with gynecology Taking metformin off and on Not currently on birth control Periods about 45 days apart, regular Recently ended last period within the last few days Open to pregnancy if it happens  Relevant past medical, surgical, family and social history reviewed. Allergies and medications reviewed and updated. Social History   Tobacco Use  Smoking Status Never Smoker  Smokeless Tobacco Never Used   ROS: Per HPI   Objective:    BP 128/83   Pulse 83   Temp 97.9 F (36.6 C) (Oral)   Ht _0  (1.575 m)   Wt 205 lb 9.6 oz (93.3 kg)   BMI 37.60 kg/m   Wt Readings from Last 3 Encounters:  03/25/17 205 lb 9.6 oz (93.3 kg)  12/22/16 200 lb 12.8 oz (91.1 kg)  08/31/16 209 lb 6.4 oz (95 kg)    Gen: NAD, alert, cooperative with exam, NCAT EYES: EOMI, no conjunctival injection, or no icterus ENT: OP without erythema LYMPH: no cervical LAD CV: NRRR, normal S1/S2, no murmur, distal pulses 2+ b/l Resp: CTABL, no wheezes, normal WOB Abd: +BS, soft, NTND. no guarding or organomegaly, no CVA tenderness Ext: No edema, warm Neuro: Alert and oriented, strength equal b/l UE and LE, coordination grossly  normal MSK: normal muscle bulk Psych: Normal affect, mood is good  Assessment & Plan:  [name] was seen today for medication refill, follow-up, back pain and urinary urgency.  Diagnoses and all orders for this visit:  UTI symptoms UA with some white cells, lots of epithelial cells We will follow-up culture prior to starting medication -     Urinalysis, Complete  Pyuria -     Urine Culture  Screening, lipid -     CMP14+EGFR  BMI 37.0-37.9, adult -     Lipid panel -     Bayer DCA Hb A1c Waived  Depression, unspecified depression type Continue venlafaxine for now, will discuss decrease next visit   Follow up plan: Return in about 3 months (around 06/23/2017) for cpe. Assunta Found, MD [place]

## 2017-03-26 LAB — CMP14+EGFR
ALBUMIN: 4.2 g/dL (ref 3.5–5.5)
ALT: 21 IU/L (ref 0–32)
AST: 16 IU/L (ref 0–40)
Albumin/Globulin Ratio: 1.6 (ref 1.2–2.2)
Alkaline Phosphatase: 96 IU/L (ref 39–117)
BUN / CREAT RATIO: 14 (ref 9–23)
BUN: 10 mg/dL (ref 6–20)
Bilirubin Total: 0.3 mg/dL (ref 0.0–1.2)
CALCIUM: 9.1 mg/dL (ref 8.7–10.2)
CO2: 24 mmol/L (ref 20–29)
CREATININE: 0.7 mg/dL (ref 0.57–1.00)
Chloride: 101 mmol/L (ref 96–106)
GFR, EST AFRICAN AMERICAN: 127 mL/min/{1.73_m2} (ref >59)
GFR, EST NON AFRICAN AMERICAN: 110 mL/min/{1.73_m2} (ref >59)
Globulin, Total: 2.7 g/dL (ref 1.5–4.5)
Glucose: 86 mg/dL (ref 65–99)
Potassium: 4.2 mmol/L (ref 3.5–5.2)
SODIUM: 139 mmol/L (ref 134–144)
TOTAL PROTEIN: 6.9 g/dL (ref 6.0–8.5)

## 2017-03-26 LAB — LIPID PANEL
CHOL/HDL RATIO: 5.9 ratio — AB (ref 0.0–4.4)
Cholesterol, Total: 213 mg/dL — ABNORMAL HIGH (ref 100–199)
HDL: 36 mg/dL — AB (ref >39)
LDL Calculated: 125 mg/dL — ABNORMAL HIGH (ref 0–99)
Triglycerides: 258 mg/dL — ABNORMAL HIGH (ref 0–149)
VLDL CHOLESTEROL CAL: 52 mg/dL — AB (ref 5–40)

## 2017-03-27 LAB — URINE CULTURE

## 2017-03-28 LAB — SPECIMEN STATUS REPORT

## 2017-03-28 LAB — VITAMIN D 25 HYDROXY (VIT D DEFICIENCY, FRACTURES): VIT D 25 HYDROXY: 7.7 ng/mL — AB (ref 30.0–100.0)

## 2017-03-28 MED ORDER — AMOXICILLIN-POT CLAVULANATE 875-125 MG PO TABS: 1.0000 | ORAL_TABLET | Freq: Two times a day (BID) | ORAL | 0 refills | Status: AC

## 2017-03-28 NOTE — Addendum Note (Signed)
Addended by: Eustaquio Maize on: 03/28/2017 08:11 AM   Modules accepted: Orders

## 2017-03-29 ENCOUNTER — Other Ambulatory Visit: Payer: Self-pay | Admitting: Pediatrics

## 2017-03-29 ENCOUNTER — Other Ambulatory Visit: Payer: Self-pay | Admitting: *Deleted

## 2017-03-29 DIAGNOSIS — E559 Vitamin D deficiency, unspecified: Secondary | ICD-10-CM

## 2017-03-29 MED ORDER — VITAMIN D (ERGOCALCIFEROL) 1.25 MG (50000 UNIT) PO CAPS: 50000.0000 [IU] | ORAL_CAPSULE | ORAL | 0 refills | Status: DC

## 2017-05-04 ENCOUNTER — Other Ambulatory Visit: Payer: Self-pay | Admitting: Pediatrics

## 2017-05-04 DIAGNOSIS — F32A Depression, unspecified: Secondary | ICD-10-CM

## 2017-05-04 DIAGNOSIS — F329 Major depressive disorder, single episode, unspecified: Secondary | ICD-10-CM

## 2017-07-06 ENCOUNTER — Other Ambulatory Visit: Payer: Self-pay | Admitting: Pediatrics

## 2017-07-06 DIAGNOSIS — F32A Depression, unspecified: Secondary | ICD-10-CM

## 2017-07-06 DIAGNOSIS — F329 Major depressive disorder, single episode, unspecified: Secondary | ICD-10-CM

## 2017-09-16 ENCOUNTER — Other Ambulatory Visit: Payer: Self-pay | Admitting: *Deleted

## 2017-09-16 DIAGNOSIS — F329 Major depressive disorder, single episode, unspecified: Secondary | ICD-10-CM

## 2017-09-16 DIAGNOSIS — F32A Depression, unspecified: Secondary | ICD-10-CM

## 2017-09-16 MED ORDER — VENLAFAXINE HCL ER 150 MG PO CP24
ORAL_CAPSULE | ORAL | 0 refills | Status: DC
Start: 2017-09-16 — End: 2020-02-14

## 2018-02-01 ENCOUNTER — Other Ambulatory Visit: Payer: Self-pay | Admitting: Obstetrics & Gynecology

## 2018-02-01 DIAGNOSIS — N6320 Unspecified lump in the left breast, unspecified quadrant: Secondary | ICD-10-CM

## 2018-02-07 ENCOUNTER — Other Ambulatory Visit: Payer: Self-pay | Admitting: Obstetrics & Gynecology

## 2018-02-07 ENCOUNTER — Ambulatory Visit
Admission: RE | Admit: 2018-02-07 | Discharge: 2018-02-07 | Disposition: A | Discharge status: Home / Self Care | Payer: PRIVATE HEALTH INSURANCE | Source: Ambulatory Visit | Attending: Obstetrics & Gynecology | Admitting: Obstetrics & Gynecology

## 2018-02-07 ENCOUNTER — Ambulatory Visit
Admission: RE | Admit: 2018-02-07 | Discharge: 2018-02-07 | Disposition: A | Discharge status: Home / Self Care | Payer: 59 | Source: Ambulatory Visit | Attending: Obstetrics & Gynecology | Admitting: Obstetrics & Gynecology

## 2018-02-07 DIAGNOSIS — N6320 Unspecified lump in the left breast, unspecified quadrant: Secondary | ICD-10-CM

## 2018-08-08 ENCOUNTER — Other Ambulatory Visit: Payer: PRIVATE HEALTH INSURANCE

## 2018-08-10 ENCOUNTER — Other Ambulatory Visit: Payer: PRIVATE HEALTH INSURANCE

## 2018-08-23 ENCOUNTER — Ambulatory Visit
Admission: RE | Admit: 2018-08-23 | Discharge: 2018-08-23 | Disposition: A | Discharge status: Home / Self Care | Payer: PRIVATE HEALTH INSURANCE | Source: Ambulatory Visit | Attending: Obstetrics & Gynecology | Admitting: Obstetrics & Gynecology

## 2018-08-23 ENCOUNTER — Other Ambulatory Visit: Payer: Self-pay

## 2018-08-23 ENCOUNTER — Ambulatory Visit
Admission: RE | Admit: 2018-08-23 | Discharge: 2018-08-23 | Disposition: A | Discharge status: Home / Self Care | Payer: Commercial Managed Care - PPO | Source: Ambulatory Visit | Attending: Obstetrics & Gynecology | Admitting: Obstetrics & Gynecology

## 2018-08-23 ENCOUNTER — Other Ambulatory Visit: Payer: Self-pay | Admitting: Obstetrics & Gynecology

## 2018-08-23 DIAGNOSIS — N6320 Unspecified lump in the left breast, unspecified quadrant: Secondary | ICD-10-CM

## 2018-12-13 NOTE — Telephone Encounter (Signed)
done

## 2019-02-27 ENCOUNTER — Other Ambulatory Visit: Payer: Commercial Managed Care - PPO

## 2019-04-18 ENCOUNTER — Other Ambulatory Visit: Payer: Commercial Managed Care - PPO

## 2019-05-04 ENCOUNTER — Other Ambulatory Visit: Payer: Commercial Managed Care - PPO

## 2019-05-13 DIAGNOSIS — G4733 Obstructive sleep apnea (adult) (pediatric): Secondary | ICD-10-CM | POA: Diagnosis not present

## 2019-05-15 DIAGNOSIS — M545 Low back pain: Secondary | ICD-10-CM | POA: Diagnosis not present

## 2019-05-15 DIAGNOSIS — Z6836 Body mass index (BMI) 36.0-36.9, adult: Secondary | ICD-10-CM | POA: Diagnosis not present

## 2019-05-15 DIAGNOSIS — R202 Paresthesia of skin: Secondary | ICD-10-CM | POA: Diagnosis not present

## 2019-05-25 ENCOUNTER — Other Ambulatory Visit: Payer: Self-pay

## 2019-05-25 ENCOUNTER — Ambulatory Visit
Admission: RE | Admit: 2019-05-25 | Discharge: 2019-05-25 | Disposition: A | Discharge status: Home / Self Care | Payer: Commercial Managed Care - PPO | Source: Ambulatory Visit | Attending: Obstetrics & Gynecology | Admitting: Obstetrics & Gynecology

## 2019-05-25 DIAGNOSIS — N6320 Unspecified lump in the left breast, unspecified quadrant: Secondary | ICD-10-CM

## 2019-05-25 DIAGNOSIS — R928 Other abnormal and inconclusive findings on diagnostic imaging of breast: Secondary | ICD-10-CM | POA: Diagnosis not present

## 2019-05-25 DIAGNOSIS — N6323 Unspecified lump in the left breast, lower outer quadrant: Secondary | ICD-10-CM | POA: Diagnosis not present

## 2019-06-22 DIAGNOSIS — Z23 Encounter for immunization: Secondary | ICD-10-CM | POA: Diagnosis not present

## 2019-07-23 DIAGNOSIS — Z23 Encounter for immunization: Secondary | ICD-10-CM | POA: Diagnosis not present

## 2019-08-30 DIAGNOSIS — B353 Tinea pedis: Secondary | ICD-10-CM | POA: Diagnosis not present

## 2019-10-30 DIAGNOSIS — L259 Unspecified contact dermatitis, unspecified cause: Secondary | ICD-10-CM | POA: Diagnosis not present

## 2019-10-30 DIAGNOSIS — Z6836 Body mass index (BMI) 36.0-36.9, adult: Secondary | ICD-10-CM | POA: Diagnosis not present

## 2019-12-11 DIAGNOSIS — Z6836 Body mass index (BMI) 36.0-36.9, adult: Secondary | ICD-10-CM | POA: Diagnosis not present

## 2019-12-11 DIAGNOSIS — R42 Dizziness and giddiness: Secondary | ICD-10-CM | POA: Diagnosis not present

## 2019-12-11 DIAGNOSIS — R079 Chest pain, unspecified: Secondary | ICD-10-CM | POA: Diagnosis not present

## 2020-02-14 ENCOUNTER — Encounter: Payer: Self-pay | Admitting: Cardiovascular Disease

## 2020-02-14 ENCOUNTER — Other Ambulatory Visit: Payer: Self-pay

## 2020-02-14 ENCOUNTER — Other Ambulatory Visit: Payer: Self-pay | Admitting: Cardiovascular Disease

## 2020-02-14 ENCOUNTER — Ambulatory Visit (INDEPENDENT_AMBULATORY_CARE_PROVIDER_SITE_OTHER): Payer: BC Managed Care – PPO | Admitting: Cardiovascular Disease

## 2020-02-14 VITALS — BP 130/92 | HR 83 | Ht 61.75 in | Wt 185.0 lb

## 2020-02-14 DIAGNOSIS — E785 Hyperlipidemia, unspecified: Secondary | ICD-10-CM

## 2020-02-14 DIAGNOSIS — R072 Precordial pain: Secondary | ICD-10-CM | POA: Diagnosis not present

## 2020-02-14 DIAGNOSIS — R7303 Prediabetes: Secondary | ICD-10-CM

## 2020-02-14 DIAGNOSIS — I10 Essential (primary) hypertension: Secondary | ICD-10-CM

## 2020-02-14 DIAGNOSIS — E8881 Metabolic syndrome: Secondary | ICD-10-CM

## 2020-02-14 MED ORDER — METOPROLOL TARTRATE 100 MG PO TABS: ORAL_TABLET | ORAL | 0 refills | Status: DC

## 2020-02-14 NOTE — Patient Instructions (Signed)
Medication Instructions:  No changes *If you need a refill on your cardiac medications before your next appointment, please call your pharmacy*   Lab Work: Your provider would like for you to have the following labs: Fasting Lipid, BMET and A1C  If you have labs (blood work) drawn today and your tests are completely normal, you will receive your results only by: Marland Kitchen MyChart Message (if you have MyChart) OR . A paper copy in the mail If you have any lab test that is abnormal or we need to change your treatment, we will call you to review the results.  Follow-Up: At [facility], you and your health needs are our priority.  As part of our continuing mission to provide you with exceptional heart care, we have created designated Provider Care Teams.  These Care Teams include your primary Cardiologist (physician) and Advanced Practice Providers (APPs -  Physician Assistants and Nurse Practitioners) who all work together to provide you with the care you need, when you need it.  We recommend signing up for the patient portal called "MyChart".  Sign up information is provided on this After Visit Summary.  MyChart is used to connect with patients for Virtual Visits (Telemedicine).  Patients are able to view lab/test results, encounter notes, upcoming appointments, etc.  Non-urgent messages can be sent to your provider as well.   To learn more about what you can do with MyChart, go to [email or web address].    Your next appointment:   5 week(s)  The format for your next appointment:   In Person  Provider:   Sanda Klein, MD   Other Instructions Your cardiac CT will be scheduled at one of the below locations:   [facility] [address] [place], [place] [address] [phone]  [place] [address] [place], [place] [address] [phone]  If scheduled at [facility], please arrive at the [facility] main  entrance of [facility] 30 minutes prior to test start time. Proceed to the [facility] Radiology Department (first floor) to check-in and test prep.  If scheduled at [facility], please arrive 15 mins early for check-in and test prep.  Please follow these instructions carefully (unless otherwise directed):   On the Night Before the Test: . Be sure to Drink plenty of water. . Do not consume any caffeinated/decaffeinated beverages or chocolate 12 hours prior to your test. . Do not take any antihistamines 12 hours prior to your test.  On the Day of the Test: . Drink plenty of water. Do not drink any water within one hour of the test. . Do not eat any food 4 hours prior to the test. . You may take your regular medications prior to the test.  . Take metoprolol (Lopressor) two hours prior to test. . FEMALES- please wear underwire-free bra if available       After the Test: . Drink plenty of water. . After receiving IV contrast, you may experience a mild flushed feeling. This is normal. . On occasion, you may experience a mild rash up to 24 hours after the test. This is not dangerous. If this occurs, you can take Benadryl 25 mg and increase your fluid intake. . If you experience trouble breathing, this can be serious. If it is severe call 911 IMMEDIATELY. If it is mild, please call our office. . If you take any of these medications: Glipizide/Metformin, Avandament, Glucavance, please do not  take 48 hours after completing test unless otherwise instructed.   Once we have confirmed authorization from your insurance company, we will call you to set up a date and time for your test. Based on how quickly your insurance processes prior authorizations requests, please allow up to 4 weeks to be contacted for scheduling your Cardiac CT appointment. Be advised that routine Cardiac CT appointments could be scheduled as many as 8 weeks after your provider has ordered  it.  For non-scheduling related questions, please contact the cardiac imaging nurse navigator should you have any questions/concerns: Marchia Bond, Cardiac Imaging Nurse Navigator Burley Saver, Interim Cardiac Imaging Nurse [place] [facility] Direct Office Dial: [phone]   For scheduling needs, including cancellations and rescheduling, please call [place], [phone].

## 2020-02-14 NOTE — Progress Notes (Signed)
Cardiology Office Note:    Date:  02/14/2020   ID:  [name], DOB Feb 24, 1981, MRN [id number]  PCP:  Lanelle Bal, PA-C  [facility] Cardiologist:  Sanda Klein, MD  [facility] Electrophysiologist:  None   Referring MD: Lanelle Bal, PA-C   Chief Complaint  Patient presents with  . New Patient (Initial Visit)  . Advice Only    chest pain  [name] is a 39 y.o. female who is being seen today for the evaluation of chest pain at the request of Lanelle Bal, [place].   History of Present Illness:    [name] is a 39 y.o. female with a hx of PCOS and prediabetes who has recently begun experiencing chest discomfort.  She recalls that this first occurred in June 2020, spontaneously during light activity (shopping at [facility]).  She suddenly experienced severe retrosternal chest pressure (she describes it laying the palm of her hand flat on the middle of her chest and pushing down).  It was associated with dyspnea and a sensation of unease.  Symptoms resolved spontaneously after several minutes.  She has had mild transient similar symptoms over the last few months but had a particularly severe events in September of this year.  She was working from home sitting in a chair under no particular emotional stress.  She experienced severe retrosternal chest pressure and her right arm felt "odd and uncomfortable".  The symptoms persisted for quite a while, about 4 hours.  At 1 point she decided she should go to the emergency room and while taking a shower felt very weak.  She did not actually go to the emergency room and the symptoms subsided gradually.  She felt tired for the rest of the week.  She did not experience full-blown syncope and did not have palpitations, but felt short of breath throughout the whole event.  In the intervals between these events she has not had issues with shortness of breath or angina either at rest or with activity but she is fairly  sedentary.  She walks and she takes care of housework, but does not really engage in any regular physical exercise.  Does not have intermittent claudication and denies any focal neurological complaints.  She does not have orthopnea, PND or lower extremity edema.  At one point she was taking Metformin for borderline diabetes and PCOS, but had to stop it due to GI side effects.  Her lipid profile shows a classic pattern of metabolic syndrome X with a low HDL cholesterol (31) mild hypertriglyceridemia (232) and mildly elevated LDL at 110.  Last year her hemoglobin A1c was not bad at 5.4%.  She has normal renal function.  She does not smoke cigarettes.  At a younger age she is to have extremely irregular menses, often only 3 times a year, but recently she has become more regular.  She has successfully completed 2 pregnancies.  Her father [name] was one of our patients and had a lengthy history of coronary disease, with premature onset.  He had bypass surgery when he was only 41 years old, eventually dying around age of 18.  She has 2 paternal uncles that also had early onset heart problems in their 39s.  She has 2 first-degree cousins on her father's side who had myocardial infarction in their 87s and one female cousin who had a stent in her 66s.  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  Past Medical History:  Diagnosis Date  . Anxiety   . Depression   . Migraines   . PCOS (polycystic ovarian syndrome)     Past Surgical History:  Procedure Laterality Date  . DNC    . TOOTH EXTRACTION      Current Medications: Current Meds  Medication Sig  . [DISCONTINUED] clindamycin (CLINDAGEL) 1 % gel Apply topically 2 (two) times daily.  . [DISCONTINUED] metFORMIN (GLUCOPHAGE) 500 MG tablet Take 1 tablet (500 mg total) by mouth 2 (two) times daily with a meal.  . [DISCONTINUED] venlafaxine XR (EFFEXOR-XR) 150 MG 24 hr capsule TAKE 1 CAPSULE BY MOUTH EVERY DAY  .  [DISCONTINUED] Vitamin D, Ergocalciferol, (DRISDOL) 50000 units CAPS capsule Take 1 capsule (50,000 Units total) by mouth every 7 (seven) days.     Allergies:   Nitrofurantoin monohyd macro, Sulfa antibiotics, and Cephalexin   Social History   Socioeconomic History  . Marital status: Married    Spouse name: Not on file  . Number of children: Not on file  . Years of education: Not on file  . Highest education level: Not on file  Occupational History  . Occupation: MRI Engineer, production: [employer]  Tobacco Use  . Smoking status: Never Smoker  . Smokeless tobacco: Never Used  Vaping Use  . Vaping Use: Never used  Substance and Sexual Activity  . Alcohol use: Yes    Alcohol/week: 0.0 standard drinks    Comment: socially  . Drug use: No  . Sexual activity: Not on file  Other Topics Concern  . Not on file  Social History Narrative  . Not on file   Social Determinants of Health   Financial Resource Strain:   . Difficulty of Paying Living Expenses: Not on file  Food Insecurity:   . Worried About Charity fundraiser in the Last Year: Not on file  . Ran Out of Food in the Last Year: Not on file  Transportation Needs:   . Lack of Transportation (Medical): Not on file  . Lack of Transportation (Non-Medical): Not on file  Physical Activity:   . Days of Exercise per Week: Not on file  . Minutes of Exercise per Session: Not on file  Stress:   . Feeling of Stress : Not on file  Social Connections:   . Frequency of Communication with Friends and Family: Not on file  . Frequency of Social Gatherings with Friends and Family: Not on file  . Attends Religious Services: Not on file  . Active Member of Clubs or Organizations: Not on file  . Attends Archivist Meetings: Not on file  . Marital Status: Not on file     Family History: The patient's family history includes Breast cancer in her paternal grandmother; COPD in her father; Diabetes in her father and mother; Heart  disease in her father; Hyperlipidemia in her father and mother; Hypertension in her father and mother; Kidney Stones in her father; Lupus in her mother and another family member; Sleep apnea in her father.  ROS:   Please see the history of present illness.     All other systems reviewed and are negative.  EKGs/Labs/Other Studies Reviewed:    The following studies were reviewed today: Notes from primary care provider  EKG:  EKG is ordered today.  The ekg ordered today demonstrates normal sinus rhythm, completely normal tracing  Recent Labs: No results found for requested labs within last 8760 hours.  Recent Lipid Panel    Component  Value Date/Time   CHOL 213 (H) 03/25/2017 1022   TRIG 258 (H) 03/25/2017 1022   HDL 36 (L) 03/25/2017 1022   CHOLHDL 5.9 (H) 03/25/2017 1022   LDLCALC 125 (H) 03/25/2017 1022    08/15/2018 Total cholesterol 187, HDL 31, triglycerides 232, LDL 110 Hemoglobin 12.4, creatinine 0.8, normal liver function test, TSH 1.36  Physical Exam:    VS:  BP (!) 130/92 (BP Location: Left Arm, Patient Position: Sitting, Cuff Size: Large)   Pulse 83   Ht 5' 1.75" (1.568 m)   Wt 185 lb (83.9 kg)   BMI 34.11 kg/m     Wt Readings from Last 3 Encounters:  02/14/20 185 lb (83.9 kg)  03/25/17 205 lb 9.6 oz (93.3 kg)  12/22/16 200 lb 12.8 oz (91.1 kg)     GEN: Moderately obese with central obesity, well nourished, well developed in no acute distress HEENT: Normal NECK: No JVD; No carotid bruits LYMPHATICS: No lymphadenopathy CARDIAC: RRR, no murmurs, rubs, gallops RESPIRATORY:  Clear to auscultation without rales, wheezing or rhonchi  ABDOMEN: Soft, non-tender, non-distended MUSCULOSKELETAL:  No edema; No deformity  SKIN: Warm and dry NEUROLOGIC:  Alert and oriented x 3 PSYCHIATRIC:  Normal affect   ASSESSMENT:    1. Dyslipidemia   2. Precordial pain   3. Prediabetes   4. Essential hypertension   5. Metabolic syndrome X    PLAN:    In order of  problems listed above:  1. Precordial pain: Atypical, but with some features worrisome for unstable angina pectoris.  No symptoms in the last few weeks.  She has numerous coronary risk factors including an extremely strong family history of premature CAD and a lipid profile strongly suggestive of atherogenic potential (likely "small dense LDL").  She has had problems with borderline diabetes mellitus and has elevated diastolic blood pressure.  Despite her young age and female gender, she appears to be at higher than average risk for CAD.  We will schedule for coronary CT angiogram.  We discussed the value of this test not only for diagnosing coronary stenosis, but also to provide an overall measurement of plaque burden from the calcium score.  Will hold off prescribing any medications until after the test is done other than aspirin 81 mg daily.   2. Dyslipidemia: If her calcium score is elevated will recommend treatment with a statin.  That weight loss and physical activities are the best ways to improve her HDL and triglycerides level and to fend off development of diabetes mellitus. 3. GGY:IRSWNIOEV on the results of the test, consider treatment of her blood pressure with either beta-blocker (if she does have coronary stenoses) or angiotensin receptor blocker (if she does not).  We will hold off on starting the ARB until she has had a contrast based procedure.  Notes that her systolic blood pressure has been in the 130-140 range, but her diastolic blood pressure has been consistently 90 or higher on last few visits 4. Metabolic syndrome\polycystic ovarian syndrome: Discussed the relationship between obesity, insulin resistance and PCOS and hypertension.    Medication Adjustments/Labs and Tests Ordered: Current medicines are reviewed at length with the patient today.  Concerns regarding medicines are outlined above.  Orders Placed This Encounter  Procedures  . CT CORONARY MORPH W/CTA COR W/SCORE W/CA  W/CM &/OR WO/CM  . CT CORONARY FRACTIONAL FLOW RESERVE DATA PREP  . CT CORONARY FRACTIONAL FLOW RESERVE FLUID ANALYSIS  . Basic metabolic panel  . Lipid panel  . Hemoglobin A1c  .  EKG 12-Lead   Meds ordered this encounter  Medications  . metoprolol tartrate (LOPRESSOR) 100 MG tablet    Sig: Take within two hours of the test    Dispense:  1 tablet    Refill:  0    For testing purposes only. No refills    Patient Instructions  Medication Instructions:  No changes *If you need a refill on your cardiac medications before your next appointment, please call your pharmacy*   Lab Work: Your provider would like for you to have the following labs: Fasting Lipid, BMET and A1C  If you have labs (blood work) drawn today and your tests are completely normal, you will receive your results only by: Marland Kitchen MyChart Message (if you have MyChart) OR . A paper copy in the mail If you have any lab test that is abnormal or we need to change your treatment, we will call you to review the results.  Follow-Up: At [facility], you and your health needs are our priority.  As part of our continuing mission to provide you with exceptional heart care, we have created designated Provider Care Teams.  These Care Teams include your primary Cardiologist (physician) and Advanced Practice Providers (APPs -  Physician Assistants and Nurse Practitioners) who all work together to provide you with the care you need, when you need it.  We recommend signing up for the patient portal called "MyChart".  Sign up information is provided on this After Visit Summary.  MyChart is used to connect with patients for Virtual Visits (Telemedicine).  Patients are able to view lab/test results, encounter notes, upcoming appointments, etc.  Non-urgent messages can be sent to your provider as well.   To learn more about what you can do with MyChart, go to [email or web address].    Your next appointment:   5 week(s)  The format for  your next appointment:   In Person  Provider:   Sanda Klein, MD   Other Instructions Your cardiac CT will be scheduled at one of the below locations:   [facility] [address] [place], [place] [address] [phone]  [place] [address] [place], [place] [address] [phone]  If scheduled at [facility], please arrive at the [facility] main entrance of [facility] 30 minutes prior to test start time. Proceed to the [facility] Radiology Department (first floor) to check-in and test prep.  If scheduled at [facility], please arrive 15 mins early for check-in and test prep.  Please follow these instructions carefully (unless otherwise directed):   On the Night Before the Test: . Be sure to Drink plenty of water. . Do not consume any caffeinated/decaffeinated beverages or chocolate 12 hours prior to your test. . Do not take any antihistamines 12 hours prior to your test.  On the Day of the Test: . Drink plenty of water. Do not drink any water within one hour of the test. . Do not eat any food 4 hours prior to the test. . You may take your regular medications prior to the test.  . Take metoprolol (Lopressor) two hours prior to test. . FEMALES- please wear underwire-free bra if available       After the Test: . Drink plenty of water. . After receiving IV contrast, you may experience a mild flushed feeling. This is normal. . On occasion, you may experience a mild rash up to 24 hours after the test.  This is not dangerous. If this occurs, you can take Benadryl 25 mg and increase your fluid intake. . If you experience trouble breathing, this can be serious. If it is severe call 911 IMMEDIATELY. If it is mild, please call our office. . If you take any of these medications: Glipizide/Metformin, Avandament, Glucavance, please do not take 48 hours after completing  test unless otherwise instructed.   Once we have confirmed authorization from your insurance company, we will call you to set up a date and time for your test. Based on how quickly your insurance processes prior authorizations requests, please allow up to 4 weeks to be contacted for scheduling your Cardiac CT appointment. Be advised that routine Cardiac CT appointments could be scheduled as many as 8 weeks after your provider has ordered it.  For non-scheduling related questions, please contact the cardiac imaging nurse navigator should you have any questions/concerns: Marchia Bond, Cardiac Imaging Nurse Navigator Burley Saver, Interim Cardiac Imaging Nurse [place] [facility] Direct Office Dial: [phone]   For scheduling needs, including cancellations and rescheduling, please call [place], [phone].       Signed, Sanda Klein, MD  02/14/2020 1:08 PM    [facility]

## 2020-02-21 DIAGNOSIS — M5410 Radiculopathy, site unspecified: Secondary | ICD-10-CM | POA: Diagnosis not present

## 2020-02-21 DIAGNOSIS — R202 Paresthesia of skin: Secondary | ICD-10-CM | POA: Diagnosis not present

## 2020-02-22 ENCOUNTER — Other Ambulatory Visit (HOSPITAL_COMMUNITY): Payer: Self-pay | Admitting: Obstetrics & Gynecology

## 2020-02-22 DIAGNOSIS — Z6836 Body mass index (BMI) 36.0-36.9, adult: Secondary | ICD-10-CM | POA: Diagnosis not present

## 2020-02-22 DIAGNOSIS — Z01419 Encounter for gynecological examination (general) (routine) without abnormal findings: Secondary | ICD-10-CM | POA: Diagnosis not present

## 2020-02-22 DIAGNOSIS — N390 Urinary tract infection, site not specified: Secondary | ICD-10-CM | POA: Diagnosis not present

## 2020-02-22 MED FILL — AMOXICILLIN 500 MG CAPSULE: 500 | 7 days supply | Qty: 14 | Fill #0

## 2020-02-22 MED FILL — METOPROLOL TARTRATE 100 MG: 100 | 1 days supply | Qty: 1 | Fill #0

## 2020-02-25 ENCOUNTER — Telehealth (HOSPITAL_COMMUNITY): Payer: Self-pay | Admitting: Emergency Medicine

## 2020-02-25 NOTE — Telephone Encounter (Signed)
Reaching out to patient to offer assistance regarding upcoming cardiac imaging study; pt verbalizes understanding of appt date/time, parking situation and where to check in, pre-test NPO status and medications ordered, and verified current allergies; name and call back number provided for further questions should they arise Jovanka Westgate RN Navigator Cardiac Imaging [facility] [phone] office [phone] cell  100mg metoprolol tartrate 2 hr prior to scan Kimberlyann Hollar  

## 2020-02-27 ENCOUNTER — Other Ambulatory Visit: Payer: Self-pay

## 2020-02-27 ENCOUNTER — Ambulatory Visit (HOSPITAL_COMMUNITY)
Admission: RE | Admit: 2020-02-27 | Discharge: 2020-02-27 | Disposition: A | Discharge status: Home / Self Care | Payer: BC Managed Care – PPO | Source: Ambulatory Visit | Attending: Cardiovascular Disease | Admitting: Cardiovascular Disease

## 2020-02-27 ENCOUNTER — Encounter (HOSPITAL_COMMUNITY): Payer: Self-pay

## 2020-02-27 ENCOUNTER — Ambulatory Visit (HOSPITAL_COMMUNITY): Payer: BC Managed Care – PPO

## 2020-02-27 DIAGNOSIS — R072 Precordial pain: Secondary | ICD-10-CM | POA: Insufficient documentation

## 2020-02-27 DIAGNOSIS — Z006 Encounter for examination for normal comparison and control in clinical research program: Secondary | ICD-10-CM

## 2020-02-27 MED ORDER — IOHEXOL 350 MG/ML SOLN
80.0000 mL | Freq: Once | INTRAVENOUS | Status: AC | PRN
  Administered 2020-02-27: 80 mL via INTRAVENOUS

## 2020-02-27 MED ORDER — NITROGLYCERIN 0.4 MG SL SUBL
SUBLINGUAL_TABLET | SUBLINGUAL | Status: AC
  Filled 2020-02-27: qty 1

## 2020-02-27 MED ORDER — NITROGLYCERIN 0.4 MG SL SUBL
0.8000 mg | SUBLINGUAL_TABLET | Freq: Once | SUBLINGUAL | Status: AC
  Administered 2020-02-27: 0.8 mg via SUBLINGUAL

## 2020-02-27 NOTE — Research (Signed)
IDENTIFY Informed Consent   Subject Name: [name]  Subject met inclusion and exclusion criteria.  The informed consent form, study requirements and expectations were reviewed with the subject and questions and concerns were addressed prior to the signing of the consent form.  The subject verbalized understanding of the trial requirements.  The subject agreed to participate in the IDENTIFY trial and signed the informed consent at 1300 on 15/DEC/2021.  The informed consent was obtained prior to performance of any protocol-specific procedures for the subject.  A copy of the signed informed consent was given to the subject and a copy was placed in the subject's medical record.   Sherlyn Lees

## 2020-02-27 NOTE — Progress Notes (Signed)
Patient tolerated CT well. Drank a soda and given water to drink and ate chips after CT. Ambulatory to exit steady gait.

## 2020-02-29 ENCOUNTER — Other Ambulatory Visit: Payer: Self-pay | Admitting: *Deleted

## 2020-02-29 DIAGNOSIS — I272 Pulmonary hypertension, unspecified: Secondary | ICD-10-CM

## 2020-03-06 ENCOUNTER — Ambulatory Visit (HOSPITAL_COMMUNITY): Payer: BC Managed Care – PPO | Attending: Internal Medicine

## 2020-03-06 ENCOUNTER — Other Ambulatory Visit: Payer: Self-pay

## 2020-03-06 DIAGNOSIS — I272 Pulmonary hypertension, unspecified: Secondary | ICD-10-CM | POA: Diagnosis not present

## 2020-03-06 LAB — ECHOCARDIOGRAM COMPLETE
Area-P 1/2: 4.06 cm2
S' Lateral: 2.5 cm

## 2020-03-20 DIAGNOSIS — R7303 Prediabetes: Secondary | ICD-10-CM | POA: Diagnosis not present

## 2020-03-20 DIAGNOSIS — R072 Precordial pain: Secondary | ICD-10-CM | POA: Diagnosis not present

## 2020-03-20 DIAGNOSIS — E785 Hyperlipidemia, unspecified: Secondary | ICD-10-CM | POA: Diagnosis not present

## 2020-03-21 LAB — BASIC METABOLIC PANEL
BUN/Creatinine Ratio: 17 (ref 9–23)
BUN: 13 mg/dL (ref 6–24)
CO2: 19 mmol/L — ABNORMAL LOW (ref 20–29)
Calcium: 8.8 mg/dL (ref 8.7–10.2)
Chloride: 105 mmol/L (ref 96–106)
Creatinine, Ser: 0.75 mg/dL (ref 0.57–1.00)
GFR calc Af Amer: 114 mL/min/{1.73_m2} (ref >59)
GFR calc non Af Amer: 99 mL/min/{1.73_m2} (ref >59)
Glucose: 91 mg/dL (ref 65–99)
Potassium: 4.3 mmol/L (ref 3.5–5.2)
Sodium: 139 mmol/L (ref 134–144)

## 2020-03-21 LAB — LIPID PANEL
Chol/HDL Ratio: 5.4 ratio — ABNORMAL HIGH (ref 0.0–4.4)
Cholesterol, Total: 182 mg/dL (ref 100–199)
HDL: 34 mg/dL — ABNORMAL LOW (ref >39)
LDL Chol Calc (NIH): 116 mg/dL — ABNORMAL HIGH (ref 0–99)
Triglycerides: 180 mg/dL — ABNORMAL HIGH (ref 0–149)
VLDL Cholesterol Cal: 32 mg/dL (ref 5–40)

## 2020-03-21 LAB — HEMOGLOBIN A1C
Est. average glucose Bld gHb Est-mCnc: 103 mg/dL
Hgb A1c MFr Bld: 5.2 % (ref 4.8–5.6)

## 2020-03-28 ENCOUNTER — Telehealth (INDEPENDENT_AMBULATORY_CARE_PROVIDER_SITE_OTHER): Payer: BC Managed Care – PPO | Admitting: Cardiovascular Disease

## 2020-03-28 VITALS — Ht 61.0 in | Wt 185.0 lb

## 2020-03-28 DIAGNOSIS — E785 Hyperlipidemia, unspecified: Secondary | ICD-10-CM | POA: Diagnosis not present

## 2020-03-28 DIAGNOSIS — R072 Precordial pain: Secondary | ICD-10-CM | POA: Diagnosis not present

## 2020-03-28 DIAGNOSIS — G4733 Obstructive sleep apnea (adult) (pediatric): Secondary | ICD-10-CM

## 2020-03-28 DIAGNOSIS — I10 Essential (primary) hypertension: Secondary | ICD-10-CM | POA: Diagnosis not present

## 2020-03-28 DIAGNOSIS — E8881 Metabolic syndrome: Secondary | ICD-10-CM | POA: Diagnosis not present

## 2020-03-28 NOTE — Progress Notes (Signed)
Virtual Visit via Video Note   This visit type was conducted due to national recommendations for restrictions regarding the COVID-19 Pandemic (e.g. social distancing) in an effort to limit this patient's exposure and mitigate transmission in our community.  Due to her co-morbid illnesses, this patient is at least at moderate risk for complications without adequate follow up.  This format is felt to be most appropriate for this patient at this time.  All issues noted in this document were discussed and addressed.  A limited physical exam was performed with this format.  Please refer to the patient's chart for her consent to telehealth for [facility].  Video Connection Lost Video connection was lost at < 50% of the duration of this visit, at which time the remainder of the visit was completed via audio only.     Date:  03/31/2020   ID:  [name], DOB Oct 22, 1980, MRN [id number] The patient was identified using 2 identifiers.  Patient Location: Home Provider Location: Office/Clinic  PCP:  Lanelle Bal, PA-C  Cardiologist:  Sanda Klein, MD  Electrophysiologist:  None   Evaluation Performed:  Follow-Up Visit  Chief Complaint:  COVID-19, fatigue  History of Present Illness:    [name] is a 40 y.o. female with a hx of PCOS and prediabetes who has recently begun experiencing chest discomfort.  We did retrieve the report of her sleep study from a year ago that confirmed that she has severe sleep apnea.  She has not been using CPAP yet.  She underwent a coronary CT angiogram that did not show any meaningful coronary disease and the calcium score was 0.  It did show mild dilation of the main pulmonary artery to 30 mm, suggesting pulmonary hypertension.  Her echocardiogram was likewise an essentially normal study except for "grade 1 diastolic dysfunction".  The right heart chambers were not enlarged and had normal systolic function, the PA pressure could not be  calculated.  Today's visit had to be converted to a remote consultation since the patient has active symptoms of confirmed COVID-19 infection.  Both she and her husband are ill, although their symptoms are improving over the last 24-48 hours.  Hard to decide whether or not she still having shortness of breath or chest pain from other reasons, but denies palpitations, dizziness, syncope, edema, orthopnea, PND, claudication or focal neurological complaints.  She feels very tired.  She is hoarse.  At one point she was taking Metformin for borderline diabetes and PCOS, but had to stop it due to GI side effects.  Her lipid profile shows a classic pattern of metabolic syndrome X with a low HDL cholesterol (31) mild hypertriglyceridemia (232) and mildly elevated LDL at 110.  Last year her hemoglobin A1c was not bad at 5.4%.  She has normal renal function.  She does not smoke cigarettes.  At a younger age she is to have extremely irregular menses, often only 3 times a year, but recently she has become more regular.  She has successfully completed 2 pregnancies.  Her father Lovett Sox was one of our patients and had a lengthy history of coronary disease, with premature onset.  He had bypass surgery when he was only 42 years old, eventually dying around age of 54.  She has 2 paternal uncles that also had early onset heart problems in their 53s.  She has 2 first-degree cousins on her father's side who had myocardial infarction in their 76s and one female cousin who had a  stent in her 60s.  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  Past Medical History:  Diagnosis Date  . Anxiety   . Depression   . Migraines   . PCOS (polycystic ovarian syndrome)     Past Surgical History:  Procedure Laterality Date  . DNC    . TOOTH EXTRACTION      Current Medications: No outpatient medications have been marked as taking for the 03/28/20 encounter (Video Visit) with Marcellius Montagna, Dani Gobble,  MD.     Allergies:   Bactrim [sulfamethoxazole-trimethoprim], Nitrofurantoin monohyd macro, Sulfa antibiotics, and Cephalexin   Social History   Socioeconomic History  . Marital status: Married    Spouse name: Not on file  . Number of children: Not on file  . Years of education: Not on file  . Highest education level: Not on file  Occupational History  . Occupation: MRI Engineer, production: [employer]  Tobacco Use  . Smoking status: Never Smoker  . Smokeless tobacco: Never Used  Vaping Use  . Vaping Use: Never used  Substance and Sexual Activity  . Alcohol use: Yes    Alcohol/week: 0.0 standard drinks    Comment: socially  . Drug use: No  . Sexual activity: Not on file  Other Topics Concern  . Not on file  Social History Narrative  . Not on file   Social Determinants of Health   Financial Resource Strain: Not on file  Food Insecurity: Not on file  Transportation Needs: Not on file  Physical Activity: Not on file  Stress: Not on file  Social Connections: Not on file     Family History: The patient's family history includes Breast cancer in her paternal grandmother; COPD in her father; Diabetes in her father and mother; Heart disease in her father; Hyperlipidemia in her father and mother; Hypertension in her father and mother; Kidney Stones in her father; Lupus in her mother and another family member; Sleep apnea in her father.  ROS:   Please see the history of present illness.     All other systems reviewed and are negative.  EKGs/Labs/Other Studies Reviewed:    The following studies were reviewed today: Notes from primary care provider  EKG:  EKG is ordered today.  The ekg ordered today demonstrates normal sinus rhythm, completely normal tracing  Recent Labs: 03/20/2020: BUN 13; Creatinine, Ser 0.75; Potassium 4.3; Sodium 139  Recent Lipid Panel    Component Value Date/Time   CHOL 182 03/20/2020 0918   TRIG 180 (H) 03/20/2020 0918   HDL 34 (L) 03/20/2020  0918   CHOLHDL 5.4 (H) 03/20/2020 0918   LDLCALC 116 (H) 03/20/2020 0918    08/15/2018 Total cholesterol 187, HDL 31, triglycerides 232, LDL 110 Hemoglobin 12.4, creatinine 0.8, normal liver function test, TSH 1.36  Physical Exam:    VS:  Ht 5\' 1"  (1.549 m)   Wt 185 lb (83.9 kg)   BMI 34.96 kg/m     Wt Readings from Last 3 Encounters:  03/28/20 185 lb (83.9 kg)  02/14/20 185 lb (83.9 kg)  03/25/17 205 lb 9.6 oz (93.3 kg)     Vital signs reviewed, unable to examine.  ASSESSMENT:    No diagnosis found. PLAN:    In order of problems listed above:  1. Precordial pain: No coronary stenoses and calcium score of 0.  It is possible she could still have microvascular angina due to her coronary risk factors.  It is also possible  that her symptoms could be related to pulmonary hypertension from untreated sleep apnea. 2. Dyslipidemia: With a calcium score of 0, its not unreasonable to try to improve her risk profile with weight loss and physical exercise (after she recovers from [place]).  We can delay starting a statin at this time.  Target LDL less than 100.  This would be readily achievable with lifestyle changes. 3. HTN: She was unable to check her blood pressure today.  Optimal agent is probably an angiotensin receptor blocker.  We will wait until she gets over the acute infection. 4. Metabolic syndrome\polycystic ovarian syndrome: Discussed the relationship between obesity, insulin resistance and PCOS and hypertension.  Weight loss would be beneficial in many ways, by reducing hyperinsulinemia, with consequent improvement in lipid profile, blood pressure and even ovarian function and prevention of full-blown diabetes mellitus. 5. OSA: Strongly recommend treatment with CPAP.  There is indirect evidence that she probably has pulmonary hypertension from this disorder.    Time:   Today, I have spent 22 minutes with the patient with telehealth technology discussing the above problems.      Medication Adjustments/Labs and Tests Ordered: Current medicines are reviewed at length with the patient today.  Concerns regarding medicines are outlined above.  Patient Instructions  Medication Instructions:  No changes *If you need a refill on your cardiac medications before your next appointment, please call your pharmacy*   Lab Work: None ordered If you have labs (blood work) drawn today and your tests are completely normal, you will receive your results only by: Marland Kitchen MyChart Message (if you have MyChart) OR . A paper copy in the mail If you have any lab test that is abnormal or we need to change your treatment, we will call you to review the results.   Testing/Procedures: None ordered   Follow-Up: At [facility], you and your health needs are our priority.  As part of our continuing mission to provide you with exceptional heart care, we have created designated Provider Care Teams.  These Care Teams include your primary Cardiologist (physician) and Advanced Practice Providers (APPs -  Physician Assistants and Nurse Practitioners) who all work together to provide you with the care you need, when you need it.  We recommend signing up for the patient portal called "MyChart".  Sign up information is provided on this After Visit Summary.  MyChart is used to connect with patients for Virtual Visits (Telemedicine).  Patients are able to view lab/test results, encounter notes, upcoming appointments, etc.  Non-urgent messages can be sent to your provider as well.   To learn more about what you can do with MyChart, go to [email or web address].    Your next appointment:   12 month(s)  The format for your next appointment:   In Person  Provider:   You may see Sanda Klein, MD or one of the following Advanced Practice Providers on your designated Care Team:    Almyra Deforest, PA-C  Fabian Sharp, [place] or   Roby Lofts, PA-C     Signed, Sanda Klein, MD  03/28/2020 11:41 AM     [place]

## 2020-03-28 NOTE — Patient Instructions (Signed)

## 2020-04-03 DIAGNOSIS — F4322 Adjustment disorder with anxiety: Secondary | ICD-10-CM | POA: Diagnosis not present

## 2020-04-17 DIAGNOSIS — F4322 Adjustment disorder with anxiety: Secondary | ICD-10-CM | POA: Diagnosis not present

## 2020-05-01 DIAGNOSIS — F4322 Adjustment disorder with anxiety: Secondary | ICD-10-CM | POA: Diagnosis not present

## 2020-05-15 DIAGNOSIS — F4322 Adjustment disorder with anxiety: Secondary | ICD-10-CM | POA: Diagnosis not present

## 2020-06-05 ENCOUNTER — Other Ambulatory Visit (HOSPITAL_COMMUNITY): Payer: Self-pay | Admitting: Family Medicine

## 2020-06-05 DIAGNOSIS — G4733 Obstructive sleep apnea (adult) (pediatric): Secondary | ICD-10-CM | POA: Diagnosis not present

## 2020-06-05 DIAGNOSIS — M545 Low back pain, unspecified: Secondary | ICD-10-CM | POA: Diagnosis not present

## 2020-06-05 DIAGNOSIS — R300 Dysuria: Secondary | ICD-10-CM | POA: Diagnosis not present

## 2020-06-05 DIAGNOSIS — Z6835 Body mass index (BMI) 35.0-35.9, adult: Secondary | ICD-10-CM | POA: Diagnosis not present

## 2020-06-05 MED FILL — levoFLOXacin 500 MG TABS: 500 | 5 days supply | Qty: 5 | Fill #0

## 2020-06-12 DIAGNOSIS — F4322 Adjustment disorder with anxiety: Secondary | ICD-10-CM | POA: Diagnosis not present

## 2020-07-02 DIAGNOSIS — F4322 Adjustment disorder with anxiety: Secondary | ICD-10-CM | POA: Diagnosis not present

## 2020-07-18 DIAGNOSIS — F4322 Adjustment disorder with anxiety: Secondary | ICD-10-CM | POA: Diagnosis not present

## 2020-07-23 ENCOUNTER — Telehealth: Payer: Self-pay

## 2020-07-23 DIAGNOSIS — Z006 Encounter for examination for normal comparison and control in clinical research program: Secondary | ICD-10-CM

## 2020-07-23 NOTE — Telephone Encounter (Signed)
I called patient for her 90-day Identify Study follow up phone call. Patient is doing well. Pt states she still has chest pain at times at rest. Patient followed up with her Cardiologist on 03/28/2020. Patient's Cardiologist recommended pt use a CPAP. I reminded patient I would call her in Dec. for 1 year follow-up.

## 2020-07-31 DIAGNOSIS — F4322 Adjustment disorder with anxiety: Secondary | ICD-10-CM | POA: Diagnosis not present

## 2020-08-14 DIAGNOSIS — F411 Generalized anxiety disorder: Secondary | ICD-10-CM | POA: Diagnosis not present

## 2020-08-18 DIAGNOSIS — F4322 Adjustment disorder with anxiety: Secondary | ICD-10-CM | POA: Diagnosis not present

## 2020-09-09 DIAGNOSIS — F411 Generalized anxiety disorder: Secondary | ICD-10-CM | POA: Diagnosis not present

## 2020-09-11 DIAGNOSIS — F4322 Adjustment disorder with anxiety: Secondary | ICD-10-CM | POA: Diagnosis not present

## 2021-01-13 ENCOUNTER — Other Ambulatory Visit: Payer: Self-pay | Admitting: Obstetrics & Gynecology

## 2021-01-13 DIAGNOSIS — Z09 Encounter for follow-up examination after completed treatment for conditions other than malignant neoplasm: Secondary | ICD-10-CM

## 2021-01-20 ENCOUNTER — Ambulatory Visit
Admission: RE | Admit: 2021-01-20 | Discharge: 2021-01-20 | Disposition: A | Discharge status: Home / Self Care | Payer: BC Managed Care – PPO | Source: Ambulatory Visit | Attending: Sports Medicine | Admitting: Sports Medicine

## 2021-01-20 ENCOUNTER — Ambulatory Visit: Payer: BC Managed Care – PPO | Admitting: Sports Medicine

## 2021-01-20 VITALS — BP 120/82 | Ht 61.0 in | Wt 200.0 lb

## 2021-01-20 DIAGNOSIS — M25511 Pain in right shoulder: Secondary | ICD-10-CM | POA: Diagnosis not present

## 2021-01-20 NOTE — Progress Notes (Signed)
PCP: Lanelle Bal, PA-C  Subjective:   HPI: Patient is a 40 y.o. female here for right shoulder pain.  On 12/24/2020, patient sustained a fall on an abducted in extended arm while trying to load a cabinet onto her chart.  She says she did feel a pop at the time of her fall.  Her pain is located anteriorly.  She describes it as aching.  She denies any swelling or tenderness palpation since her injury.  She states the pain is nagging at nighttime, but does not disrupt her sleep.  The pain does wax and wane, it is worse with internal rotation or trying to do anything behind her back.  She does have some tightness of the arm and tingling just superior to the elbow. She is taking ibuprofen 800 mg twice week as needed.  She is not using ice, heat, or stretching.        Objective:  Physical Exam:  Gen: NAD, comfortable in exam room  MSK: No asymmetry, no erythema or edema at the anterior aspect of the shoulder, full active and passive range of motion in abduction, forward flexion, internal, and external rotation.  5/5 strength in abduction, internal and external rotation, positive speeds test, positive O'Brien's test.  Complete shoulder ultrasound:   Biceps tendon was well visualized in the bicipital groove without abnormality.  Subscapularis, infraspinatus, and supraspinatus were all visualized as well.  I see no obvious rotator cuff tear.  AC joint shows no significant arthropathy or effusion.  Glenohumeral joint also unremarkable.  X-ray of her right shoulder including AP and axillary views are unremarkable.  No obvious bony Bankart or Hill-Sachs lesion.   Assessment & Plan:  1. Acute pain of the right shoulder - Most clinically suspicious for a labral tear given history and mechanism of injury -  MR arthrogram to obtain more clinical information and decide if this is a surgical diagnosis - Will follow up after results of MR arthrogram have returned  Adolm Joseph, [place],  PGY-3  Patient seen and evaluated with the resident.  I agree with the above plan of care.  X-rays are unremarkable.  Bedside ultrasound today does not suggest a significant rotator cuff injury.  Her mechanism of injury and physical exam suggest labral tear.  We will get an MRI arthrogram to evaluate further.  Phone follow-up with those results when available and we will delineate further treatment based on those findings.

## 2021-02-10 ENCOUNTER — Ambulatory Visit
Admission: RE | Admit: 2021-02-10 | Discharge: 2021-02-10 | Disposition: A | Discharge status: Home / Self Care | Payer: BC Managed Care – PPO | Source: Ambulatory Visit | Attending: Sports Medicine | Admitting: Sports Medicine

## 2021-02-10 ENCOUNTER — Other Ambulatory Visit: Payer: Self-pay

## 2021-02-10 DIAGNOSIS — S4991XA Unspecified injury of right shoulder and upper arm, initial encounter: Secondary | ICD-10-CM | POA: Diagnosis not present

## 2021-02-10 DIAGNOSIS — M19011 Primary osteoarthritis, right shoulder: Secondary | ICD-10-CM | POA: Diagnosis not present

## 2021-02-10 DIAGNOSIS — M25511 Pain in right shoulder: Secondary | ICD-10-CM

## 2021-02-10 MED ORDER — IOPAMIDOL (ISOVUE-M 200) INJECTION 41%
15.0000 mL | Freq: Once | INTRAMUSCULAR | Status: AC
  Administered 2021-02-10: 15 mL via INTRA_ARTICULAR

## 2021-02-15 ENCOUNTER — Encounter: Payer: Self-pay | Admitting: Sports Medicine

## 2021-02-16 ENCOUNTER — Other Ambulatory Visit: Payer: Self-pay | Admitting: Obstetrics & Gynecology

## 2021-02-16 ENCOUNTER — Telehealth: Payer: Self-pay | Admitting: Sports Medicine

## 2021-02-16 DIAGNOSIS — D242 Benign neoplasm of left breast: Secondary | ICD-10-CM

## 2021-02-16 NOTE — Telephone Encounter (Signed)
  I spoke with the patient on the phone after reviewing results of her MRI arthrogram of the right shoulder.  There is no evidence of rotator cuff tear or labral tear.  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  Based on these findings, I suspect she subluxed her shoulder but since there is no evidence of labral pathology I recommended a trial of formal physical therapy.  She would like to try to schedule PT at [facility].  She is familiar with a physical therapist at that office.  She will call me if she needs a referral faxed.  Follow-up for ongoing or recalcitrant issues.

## 2021-02-19 ENCOUNTER — Ambulatory Visit
Admission: RE | Admit: 2021-02-19 | Discharge: 2021-02-19 | Disposition: A | Discharge status: Home / Self Care | Payer: BC Managed Care – PPO | Source: Ambulatory Visit | Attending: Obstetrics & Gynecology | Admitting: Obstetrics & Gynecology

## 2021-02-19 DIAGNOSIS — D242 Benign neoplasm of left breast: Secondary | ICD-10-CM

## 2021-02-19 DIAGNOSIS — R922 Inconclusive mammogram: Secondary | ICD-10-CM | POA: Diagnosis not present

## 2021-02-24 DIAGNOSIS — Z6835 Body mass index (BMI) 35.0-35.9, adult: Secondary | ICD-10-CM | POA: Diagnosis not present

## 2021-02-24 DIAGNOSIS — Z01419 Encounter for gynecological examination (general) (routine) without abnormal findings: Secondary | ICD-10-CM | POA: Diagnosis not present

## 2021-03-02 ENCOUNTER — Other Ambulatory Visit: Payer: Self-pay

## 2021-03-02 DIAGNOSIS — M25511 Pain in right shoulder: Secondary | ICD-10-CM

## 2021-03-24 DIAGNOSIS — M25511 Pain in right shoulder: Secondary | ICD-10-CM | POA: Diagnosis not present

## 2021-08-11 IMAGING — CT CT HEART MORP W/ CTA COR W/ SCORE W/ CA W/CM &/OR W/O CM
4 of 7 series · 8 of 20 positions shown, 9 images · IV contrast (APPLIED)
Comparison: None.
COMPARISON: None.

Addendum:
EXAM:
OVER-READ INTERPRETATION  CT CHEST

The following report is an over-read performed by radiologist Dr.
Gudala Len [REDACTED] on 02/27/2020. This
over-read does not include interpretation of cardiac or coronary
anatomy or pathology. The coronary calcium score/coronary CTA
interpretation by the cardiologist is attached.
HISTORY: 39 yo female with chest pain, nonspecific
Cardiac/Coronary CTA
TECHNIQUE: The patient was scanned on a Siemens Force scanner.
PROTOCOL: A 120 kV prospective scan was triggered in the descending thoracic
aorta at 111 HU's. Axial non-contrast 3 mm slices were carried out
through the heart. The data set was analyzed on a dedicated work
station and scored using the Agatson method. Gantry rotation speed
was 250 msecs and collimation was .6 mm. Beta blockade and 0.8 mg of
sl NTG was given. The 3D data set was reconstructed in 5% intervals
of the 67-82 % of the R-R cycle. Diastolic phases were analyzed on a
dedicated work station using MPR, MIP and VRT modes. The patient
received 80mL OMNIPAQUE IOHEXOL 350 MG/ML SOLN of contrast.

[Series 6: best diast · axial · 0.39mm/px · z∈[-210,-173]mm · 2 of 276 slices shown, 3 images]
[im 92/276  vessel]
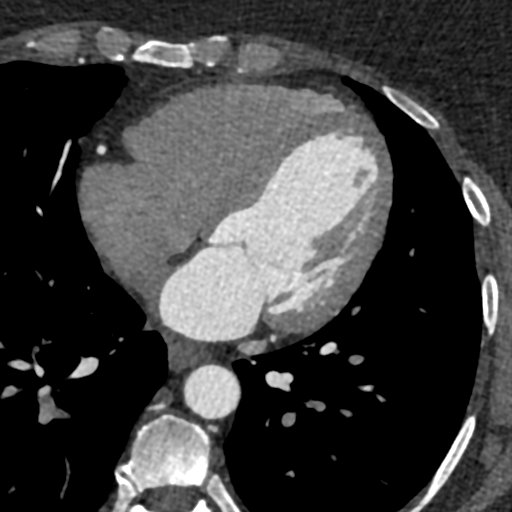
[im 92/276  lung]
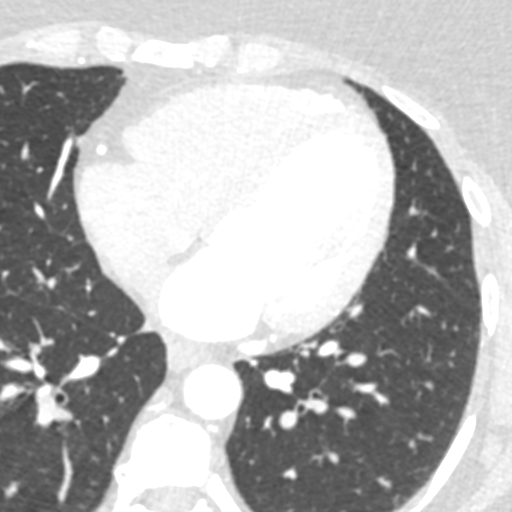
[im 184/276  vessel]
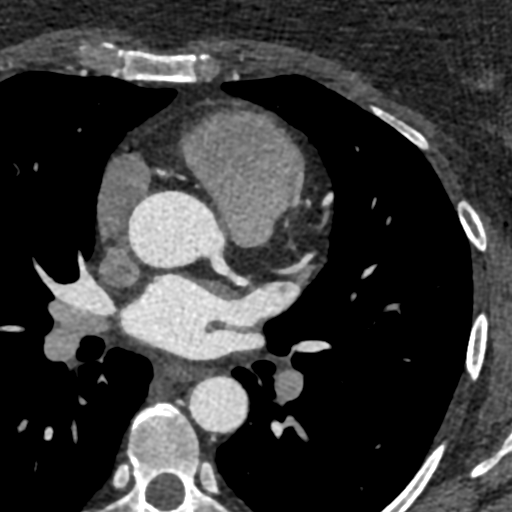

[Series 7: best syst · axial · 0.39mm/px · z∈[-210,-173]mm · 2 of 276 slices shown]
[im 92/276  vessel]
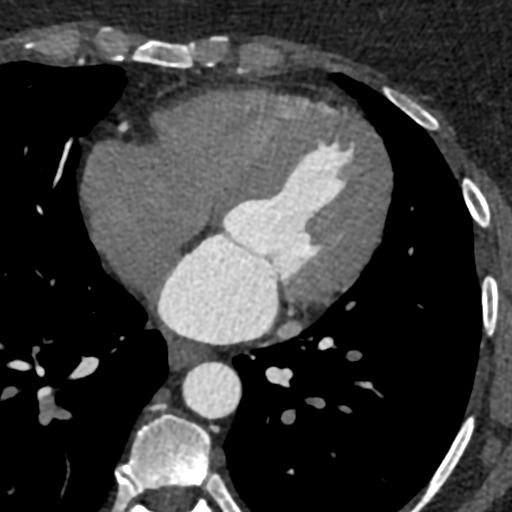
[im 184/276  vessel]
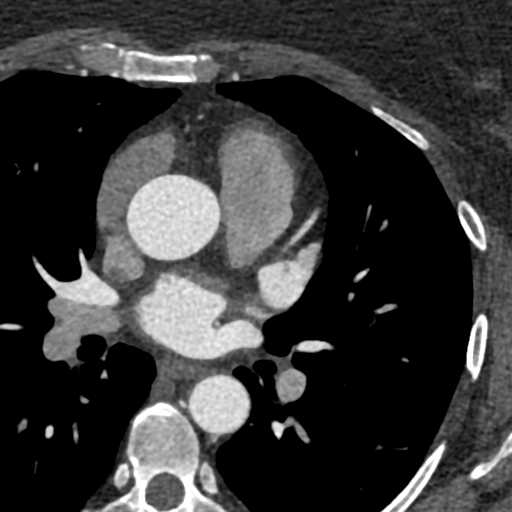

[Series 8: ts diast sharp · axial · 0.39mm/px · z∈[-210,-173]mm · 2 of 276 slices shown]
[im 92/276  lung]
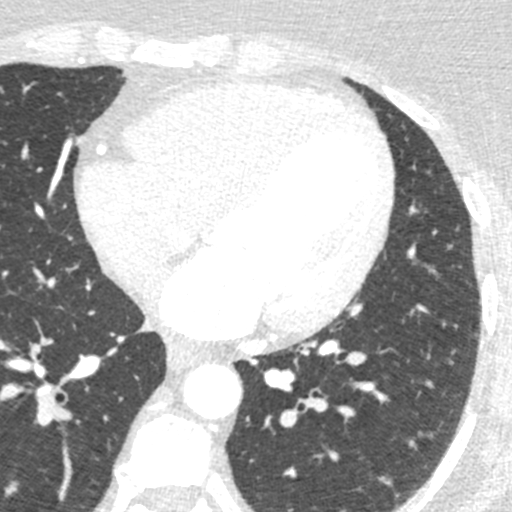
[im 184/276  lung]
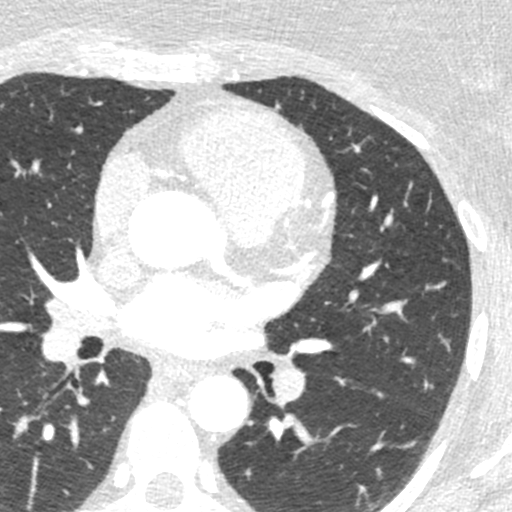

[Series 9: ts syst sharp · axial · 0.39mm/px · z∈[-210,-173]mm · 2 of 276 slices shown]
[im 92/276  lung]
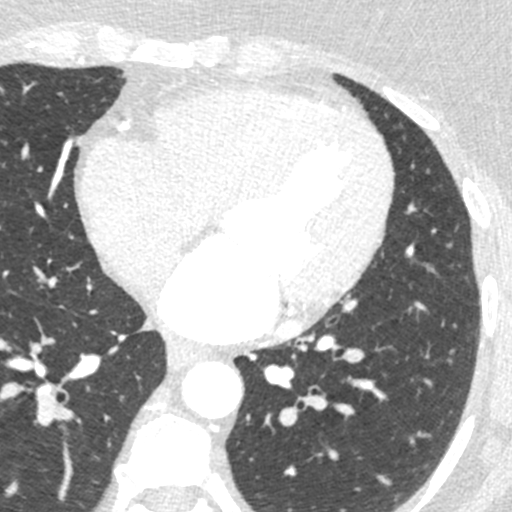
[im 184/276  lung]
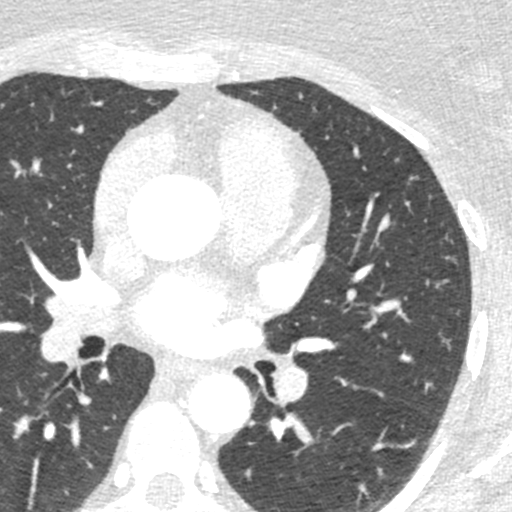

[8 of 20 positions shown; findings below may reference images not displayed]

FINDINGS: Within the visualized portions of the thorax there are no suspicious
appearing pulmonary nodules or masses, there is no acute
consolidative airspace disease, no pleural effusions, no
pneumothorax and no lymphadenopathy. Visualized portions of the
upper abdomen are unremarkable. There are no aggressive appearing
lytic or blastic lesions noted in the visualized portions of the
skeleton.
IMPRESSION: No significant incidental noncardiac findings are noted.
FINDINGS: Quality: Very good, HR 65

Coronary calcium score: The patient's coronary artery calcium score
is 0, which places the patient in the 0 percentile.

Coronary arteries: Normal coronary origins.  Right dominance.

Right Coronary Artery: Dominant. Normal. Small R-PLB and R-PDA
branches.

Left Main Coronary Artery: Short normal vessel. Bifurcates as usual
into the LAD and LCx arteries.

Left Anterior Descending Coronary Artery: Normal LAD that reaches
the apex. Large proximal D1 branch without disease.

Left Circumflex Artery: AV groove LCx vessel without disease.
Moderate proximal OM1 branch without disease.

Aorta: Normal size, 32 mm at the mid ascending aorta (level of the
PA bifurcation) measured double oblique. No calcifications. No
dissection.

Aortic Valve: Trileaflet.  No calcifications.

Other findings:

Normal pulmonary vein drainage into the left atrium.

Normal left atrial appendage without a thrombus.

Dilated main pulmonary artery to 30 mm, suggestive of pulmonary
hypertension.
IMPRESSION: 1. No evidence of CAD, CADRADS = 0.

2. Coronary calcium score of 0. This was 0 percentile for age and
sex matched control.

3. Normal coronary origin with right dominance.

4. Dilated main pulmonary artery to 30 mm, suggestive of pulmonary
hypertension.

*** End of Addendum ***
EXAM:
OVER-READ INTERPRETATION  CT CHEST

The following report is an over-read performed by radiologist Dr.
Gudala Len [REDACTED] on 02/27/2020. This
over-read does not include interpretation of cardiac or coronary
anatomy or pathology. The coronary calcium score/coronary CTA
interpretation by the cardiologist is attached.
FINDINGS: Within the visualized portions of the thorax there are no suspicious
appearing pulmonary nodules or masses, there is no acute
consolidative airspace disease, no pleural effusions, no
pneumothorax and no lymphadenopathy. Visualized portions of the
upper abdomen are unremarkable. There are no aggressive appearing
lytic or blastic lesions noted in the visualized portions of the
skeleton.
IMPRESSION: No significant incidental noncardiac findings are noted.

## 2022-10-19 ENCOUNTER — Encounter (HOSPITAL_BASED_OUTPATIENT_CLINIC_OR_DEPARTMENT_OTHER): Payer: Self-pay | Admitting: Radiology

## 2022-10-19 DIAGNOSIS — Z1231 Encounter for screening mammogram for malignant neoplasm of breast: Secondary | ICD-10-CM

## 2022-12-06 ENCOUNTER — Other Ambulatory Visit (HOSPITAL_BASED_OUTPATIENT_CLINIC_OR_DEPARTMENT_OTHER): Payer: Self-pay

## 2023-02-14 ENCOUNTER — Inpatient Hospital Stay (HOSPITAL_BASED_OUTPATIENT_CLINIC_OR_DEPARTMENT_OTHER): Admission: RE | Admit: 2023-02-14 | Payer: BC Managed Care – PPO | Source: Ambulatory Visit | Admitting: Radiology

## 2023-02-14 DIAGNOSIS — Z1231 Encounter for screening mammogram for malignant neoplasm of breast: Secondary | ICD-10-CM
